# Patient Record
Sex: Female | Born: 1960 | Race: Black or African American | Hispanic: No | Marital: Single | Smoking: Former smoker
Health system: Southern US, Community
[De-identification: ages and names within clinical notes are randomized; demographics above are authoritative.]

## PROBLEM LIST (undated history)

## (undated) DIAGNOSIS — G8929 Other chronic pain: Secondary | ICD-10-CM

## (undated) DIAGNOSIS — E119 Type 2 diabetes mellitus without complications: Secondary | ICD-10-CM

## (undated) DIAGNOSIS — M5416 Radiculopathy, lumbar region: Secondary | ICD-10-CM

## (undated) DIAGNOSIS — I10 Essential (primary) hypertension: Secondary | ICD-10-CM

## (undated) DIAGNOSIS — F329 Major depressive disorder, single episode, unspecified: Secondary | ICD-10-CM

## (undated) DIAGNOSIS — K219 Gastro-esophageal reflux disease without esophagitis: Secondary | ICD-10-CM

## (undated) DIAGNOSIS — F32A Depression, unspecified: Secondary | ICD-10-CM

## (undated) DIAGNOSIS — E782 Mixed hyperlipidemia: Secondary | ICD-10-CM

## (undated) DIAGNOSIS — M549 Dorsalgia, unspecified: Secondary | ICD-10-CM

## (undated) DIAGNOSIS — F419 Anxiety disorder, unspecified: Secondary | ICD-10-CM

## (undated) DIAGNOSIS — G4733 Obstructive sleep apnea (adult) (pediatric): Secondary | ICD-10-CM

## (undated) DIAGNOSIS — E669 Obesity, unspecified: Secondary | ICD-10-CM

## (undated) HISTORY — DX: Type 2 diabetes mellitus without complications: E11.9

## (undated) HISTORY — DX: Obstructive sleep apnea (adult) (pediatric): G47.33

## (undated) HISTORY — DX: Gastro-esophageal reflux disease without esophagitis: K21.9

## (undated) HISTORY — DX: Mixed hyperlipidemia: E78.2

## (undated) HISTORY — DX: Essential (primary) hypertension: I10

## (undated) HISTORY — DX: Obesity, unspecified: E66.9

## (undated) HISTORY — DX: Major depressive disorder, single episode, unspecified: F32.9

## (undated) HISTORY — DX: Depression, unspecified: F32.A

---

## 2009-01-26 ENCOUNTER — Emergency Department (HOSPITAL_COMMUNITY): Admission: EM | Admit: 2009-01-26 | Discharge: 2009-01-26 | Payer: Self-pay | Admitting: Emergency Medicine

## 2009-08-23 ENCOUNTER — Emergency Department (HOSPITAL_COMMUNITY): Admission: EM | Admit: 2009-08-23 | Discharge: 2009-08-23 | Payer: Self-pay | Admitting: Emergency Medicine

## 2010-02-18 ENCOUNTER — Ambulatory Visit: Payer: Self-pay | Admitting: Internal Medicine

## 2010-02-18 DIAGNOSIS — J45909 Unspecified asthma, uncomplicated: Secondary | ICD-10-CM | POA: Insufficient documentation

## 2010-02-18 DIAGNOSIS — E785 Hyperlipidemia, unspecified: Secondary | ICD-10-CM | POA: Insufficient documentation

## 2010-02-18 DIAGNOSIS — R05 Cough: Secondary | ICD-10-CM

## 2010-02-18 DIAGNOSIS — I10 Essential (primary) hypertension: Secondary | ICD-10-CM | POA: Insufficient documentation

## 2010-02-18 DIAGNOSIS — G4730 Sleep apnea, unspecified: Secondary | ICD-10-CM | POA: Insufficient documentation

## 2010-02-23 ENCOUNTER — Telehealth (INDEPENDENT_AMBULATORY_CARE_PROVIDER_SITE_OTHER): Payer: Self-pay | Admitting: *Deleted

## 2010-03-05 ENCOUNTER — Ambulatory Visit: Payer: Self-pay | Admitting: Internal Medicine

## 2010-04-09 ENCOUNTER — Emergency Department (HOSPITAL_COMMUNITY): Admission: EM | Admit: 2010-04-09 | Discharge: 2010-04-09 | Payer: Self-pay | Admitting: Emergency Medicine

## 2010-04-29 ENCOUNTER — Telehealth (INDEPENDENT_AMBULATORY_CARE_PROVIDER_SITE_OTHER): Payer: Self-pay | Admitting: *Deleted

## 2010-05-03 ENCOUNTER — Encounter: Admission: RE | Admit: 2010-05-03 | Discharge: 2010-05-03 | Payer: Self-pay | Admitting: Family Medicine

## 2010-05-14 ENCOUNTER — Ambulatory Visit: Payer: Self-pay | Admitting: Internal Medicine

## 2010-06-10 ENCOUNTER — Ambulatory Visit: Payer: Self-pay | Admitting: Internal Medicine

## 2010-07-08 ENCOUNTER — Telehealth: Payer: Self-pay | Admitting: Internal Medicine

## 2010-08-11 ENCOUNTER — Ambulatory Visit (HOSPITAL_COMMUNITY)
Admission: RE | Admit: 2010-08-11 | Discharge: 2010-08-11 | Payer: Self-pay | Source: Home / Self Care | Admitting: Family Medicine

## 2010-12-15 NOTE — Progress Notes (Signed)
Summary: Records request from [facility]  Request for records received from [facility]. Request forwarded to [facility]. Dena Chavis  February 23, 2010 9:53 AM

## 2010-12-15 NOTE — Progress Notes (Signed)
  Request Recieved from [facility] sent to[facility]  [facility]  April 29, 2010 2:09 PM

## 2010-12-15 NOTE — Progress Notes (Signed)
Summary: nos appt  Phone Note Call from Patient   Caller: [email or web address]  Call For: Henryetta Corriveau Summary of Call: In ref to nos from 8/23, pt states she will call to rsc later this week. Initial call taken by: Darletta Moll,  July 08, 2010 10:03 AM

## 2010-12-15 NOTE — Assessment & Plan Note (Signed)
Summary: NP follow up - med calendar   Copy to:  Belva Agee, FNP Primary Provider/Referring Provider:  Belva Agee, FNP  CC:  new med calendar - pt brought all meds with her today.  no complaints..  History of Present Illness: 50 yobf quit  smoking 2005 with no resp problems that she can recall @ wt = 279  February 18, 2010 cc cough started  08/2009 while  on lisinopril stopped but seems gradually getting worse  assoc with doe, seemed better zpak / prednisone better for few weeks/   using lots of halls.  Seems worse about 30 min after lying down unless lies on  back,  does not typically  worse in ams  not much mucus and it's not  clearly assoc with nasal drainage.      March 05, 2010--Presents for follow up and med review. Last visit, tx w/ steroid taper and GERD /Rhinitis prevention. Now off ACE. He is feeling much better, 90% better.  Cough is much better, barely any cough. We reviewed all her meds and organized them in a med calendar. Denies chest pain, dyspnea, orthopnea, hemoptysis, fever, n/v/d, edema, headache.   Preventive Screening-Counseling & Management  Alcohol-Tobacco     Smoking Status: quit  Medications Prior to Update: 1)  Glimepiride 2 Mg Tabs (Glimepiride) .Marland Kitchen.. 1 Once Daily 2)  Actos 30 Mg Tabs (Pioglitazone Hcl) .Marland Kitchen.. 1 Once Daily 3)  Metformin Hcl 1000 Mg Tabs (Metformin Hcl) .Marland Kitchen.. 1 Two Times A Day 4)  Diovan 40 Mg Tabs (Valsartan) .Marland Kitchen.. 1 Once Daily 5)  Simvastatin (? Strength) .Marland Kitchen.. 1 Once Daily 6)  Tricor 48 Mg Tabs (Fenofibrate) .Marland Kitchen.. 1 Once Daily 7)  Omeprazole 20 Mg  Cpdr (Omeprazole) .... By Mouth Daily. Take One Half Hour Before Eating Breakfast 8)  Cpap .... At Bedtime 9)  Chlor-Trimeton 4 Mg Tabs (Chlorpheniramine Maleate) .... One Every 6 Hours For Dripping Nose Sneezing Itch 10)  Ventolin Hfa 108 (90 Base) Mcg/act Aers (Albuterol Sulfate) .... 2 Puffs Every 4-6 Hours As Needed 11)  Aspirin 81 Mg Tbec (Aspirin) .Marland Kitchen.. 1 Once Daily 12)  Prednisone 10 Mg  Tabs  (Prednisone) .... 4 Each Am X 2days, 2x2days, 1x2days and Stop  Current Medications (verified): 1)  Glimepiride 2 Mg Tabs (Glimepiride) .Marland Kitchen.. 1 Once Daily W/ Meal 2)  Actos 30 Mg Tabs (Pioglitazone Hcl) .Marland Kitchen.. 1 Once Daily W/ Meal 3)  Metformin Hcl 1000 Mg Tabs (Metformin Hcl) .Marland Kitchen.. 1 Two Times A Day With Breakfast and Dinner 4)  Diovan 40 Mg Tabs (Valsartan) .Marland Kitchen.. 1 Once Daily 5)  Hydrochlorothiazide 25 Mg Tabs (Hydrochlorothiazide) .... Take 1 Tablet By Mouth Once A Day 6)  Gabapentin 300 Mg Caps (Gabapentin) .... Take 1 Capsule By Mouth Three Times A Day 7)  Simvastatin 40 Mg Tabs (Simvastatin) .... Take 1 Tab By Mouth At Bedtime 8)  Tricor 48 Mg Tabs (Fenofibrate) .... Take 1 Tab By Mouth At Bedtime 9)  Omeprazole 20 Mg  Cpdr (Omeprazole) .... By Mouth Daily. Take One Half Hour Before Eating Breakfast 10)  Cpap .... At Bedtime 11)  Delsym 30 Mg/51ml Lqcr (Dextromethorphan Polistirex) .Marland Kitchen.. 1-2 Teaspoons Every 12 Hours As Needed 12)  Chlor-Trimeton 4 Mg Tabs (Chlorpheniramine Maleate) .... One Every 6 Hours As Needed 13)  Ventolin Hfa 108 (90 Base) Mcg/act Aers (Albuterol Sulfate) .... 2 Puffs Every 4-6 Hours As Needed 14)  Fluconazole 150 Mg Tabs (Fluconazole) .Marland Kitchen.. 1 Tab For 1 Day As Needed 15)  Diazepam 5 Mg  Tabs (Diazepam) .Marland Kitchen.. 1 Tab By Mouth Every 8 Hours As Needed 16)  Ibuprofen 200 Mg Tabs (Ibuprofen) .... Per Bottle  Allergies (verified): 1)  ! * Robitussin  Past History:  Family History: Last updated: 02/18/2010 Emphysema- Paternal Aunt Asthma- Maternal Uncles Heart dz- Mother and Father (both had MI) Throat CA- Brother RA- Mother and Father  Social History: Last updated: 02/18/2010 Single Lives with Son Unemployed Former smoker.  Quit in 2005.  Smoked approx 28 yrs.  Smoked up to 1/2 ppd. No ETOH since 1991  Risk Factors: Smoking Status: quit (03/05/2010)  Past Medical History: Asthma Diabetes Hyperlipidemia Hypertension Sleep  Apnea Cough...........................................................Marland KitchenWert Complex med regimen-Meds reviewed with pt education and computerized med calendar completed March 09, 2010  Social History: Smoking Status:  quit  Review of Systems      See HPI  Vital Signs:  Patient profile:   50 year old female Height:      66 inches Weight:      355 pounds BMI:     57.51 O2 Sat:      97 % on Room air Temp:     98.7 degrees F oral Pulse rate:   80 / minute BP sitting:   128 / 80  (left arm) Cuff size:   regular  Vitals Entered By: Boone Master CNA (March 05, 2010 2:13 PM)  O2 Flow:  Room air CC: new med calendar - pt brought all meds with her today.  no complaints. Is Patient Diabetic? Yes Comments Medications reviewed with patient Daytime contact number verified with patient. Boone Master CNA  March 05, 2010 2:14 PM    Physical Exam  Additional Exam:  obese hoarse bf nad  wt 346 February 18, 2010 >>355 March 09, 2010  HEENT: nl dentition, turbinates, and orophanx. Nl external ear canals without cough reflex NECK :  without JVD/Nodes/TM/ nl carotid upstrokes bilaterally LUNGS: no acc muscle use, clear to A and P bilaterally without cough on insp or exp maneuvers CV:  RRR  no s3 or murmur or increase in P2, no edema  ABD:  soft and nontender with nl excursion in the supine position. No bruits or organomegaly, bowel sounds nl MS:  warm without deformities, calf tenderness, cyanosis or clubbing SKIN: warm and dry without lesions   NEURO:  alert, approp, no deficits     Impression & Recommendations:  Problem # 1:  COUGH (ICD-786.2)  Improved w/ d/c of ACE and tx aimed at GERD/Rhinitis prevention Meds reviewed with pt education and computerized med calendar completed/adjusted.    REC:  Remain off of lovaza and all oil based vitamin (vit d)  GERD (REFLUX)  is a common cause of respiratory symptoms. It commonly presents without heartburn and can be treated with medication,  but also with lifestyle changes including avoidance of late meals, excessive alcohol, smoking cessation, and avoid fatty foods, chocolate, peppermint, colas, red wine, and acidic juices such as orange juice. NO MINT OR MENTHOL PRODUCTS SO NO COUGH DROPS  USE SUGARLESS CANDY INSTEAD (jolley ranchers)  Follow med calendar closely and bring to each visit.  follow up 3 weeks Dr. Sherene Sires   Orders: Est. Patient Level III [phone])  Medications Added to Medication List This Visit: 1)  Glimepiride 2 Mg Tabs (Glimepiride) .Marland Kitchen.. 1 once daily w/ meal 2)  Actos 30 Mg Tabs (Pioglitazone hcl) .Marland Kitchen.. 1 once daily w/ meal 3)  Metformin Hcl 1000 Mg Tabs (Metformin hcl) .Marland Kitchen.. 1 two times a day with breakfast and  dinner 4)  Hydrochlorothiazide 25 Mg Tabs (Hydrochlorothiazide) .... Take 1 tablet by mouth once a day 5)  Gabapentin 300 Mg Caps (Gabapentin) .... Take 1 capsule by mouth three times a day 6)  Simvastatin 40 Mg Tabs (Simvastatin) .... Take 1 tab by mouth at bedtime 7)  Tricor 48 Mg Tabs (Fenofibrate) .... Take 1 tab by mouth at bedtime 8)  Delsym 30 Mg/56ml Lqcr (Dextromethorphan polistirex) .Marland Kitchen.. 1-2 teaspoons every 12 hours as needed 9)  Chlor-trimeton 4 Mg Tabs (Chlorpheniramine maleate) .... One every 6 hours as needed 10)  Fluconazole 150 Mg Tabs (Fluconazole) .Marland Kitchen.. 1 tab for 1 day as needed 11)  Diazepam 5 Mg Tabs (Diazepam) .Marland Kitchen.. 1 tab by mouth every 8 hours as needed 12)  Ibuprofen 200 Mg Tabs (Ibuprofen) .... Per bottle  Complete Medication List: 1)  Glimepiride 2 Mg Tabs (Glimepiride) .Marland Kitchen.. 1 once daily w/ meal 2)  Actos 30 Mg Tabs (Pioglitazone hcl) .Marland Kitchen.. 1 once daily w/ meal 3)  Metformin Hcl 1000 Mg Tabs (Metformin hcl) .Marland Kitchen.. 1 two times a day with breakfast and dinner 4)  Diovan 40 Mg Tabs (Valsartan) .Marland Kitchen.. 1 once daily 5)  Hydrochlorothiazide 25 Mg Tabs (Hydrochlorothiazide) .... Take 1 tablet by mouth once a day 6)  Gabapentin 300 Mg Caps (Gabapentin) .... Take 1 capsule by mouth three times a  day 7)  Simvastatin 40 Mg Tabs (Simvastatin) .... Take 1 tab by mouth at bedtime 8)  Tricor 48 Mg Tabs (Fenofibrate) .... Take 1 tab by mouth at bedtime 9)  Omeprazole 20 Mg Cpdr (Omeprazole) .... By mouth daily. take one half hour before eating breakfast 10)  Cpap  .... At bedtime 11)  Delsym 30 Mg/18ml Lqcr (Dextromethorphan polistirex) .Marland Kitchen.. 1-2 teaspoons every 12 hours as needed 12)  Chlor-trimeton 4 Mg Tabs (Chlorpheniramine maleate) .... One every 6 hours as needed 13)  Ventolin Hfa 108 (90 Base) Mcg/act Aers (Albuterol sulfate) .... 2 puffs every 4-6 hours as needed 14)  Fluconazole 150 Mg Tabs (Fluconazole) .Marland Kitchen.. 1 tab for 1 day as needed 15)  Diazepam 5 Mg Tabs (Diazepam) .Marland Kitchen.. 1 tab by mouth every 8 hours as needed 16)  Ibuprofen 200 Mg Tabs (Ibuprofen) .... Per bottle   Patient Instructions: 1)  Remain off of lovaza and all oil based vitamin (vit d)  2)  GERD (REFLUX)  is a common cause of respiratory symptoms. It commonly presents without heartburn and can be treated with medication, but also with lifestyle changes including avoidance of late meals, excessive alcohol, smoking cessation, and avoid fatty foods, chocolate, peppermint, colas, red wine, and acidic juices such as orange juice. NO MINT OR MENTHOL PRODUCTS SO NO COUGH DROPS  3)  USE SUGARLESS CANDY INSTEAD (jolley ranchers)  4)  Follow med calendar closely and bring to each visit.  5)  follow up 3 weeks Dr. Sherene Sires    Immunization History:  Influenza Immunization History:    Influenza:  declines (02/26/2010)

## 2010-12-15 NOTE — Assessment & Plan Note (Signed)
Summary: Pulmonary/ ext f/u with hfa now 75% but dulera count way off   Copy to:  Belva Agee, FNP Primary Provider/Referring Provider:  Belva Agee, FNP  CC:  4 wk followup with cxr.  Pt states that cough is the same- no better or worse.  Cough is worse at night.  She also c/o increased SOB with exertion over the past several wks.  She states sometimes she wheezes when lies down.  Marland Kitchen  History of Present Illness: 18 yobf quit  smoking 2005 with no resp problems that she can recall @ wt = 279  February 18, 2010 cc cough started  08/2009 while  on lisinopril stopped but seems gradually getting worse  assoc with doe, seemed better zpak / prednisone better for few weeks/   using lots of halls.  Seems worse about 30 min after lying down unless lies on  back,  does not typically  worse in ams  not much mucus and it's not  clearly assoc with nasal drainage.      March 05, 2010--Presents for follow up and med review. Last visit, tx w/ steroid taper and GERD /Rhinitis prevention. Now off ACE.  feeling much better, 90% better.  Cough is much better, barely any cough. We reviewed all her meds and organized them in a med calendar.   May 14, 2010 Followup.  Pt states that her cough is "a little better".  Still has prod cough with clear sputum- usually only occurs at hs but doesn't wake her at night or exac in am. using albuterol an avg of twice dialy for relief of cough and sob.  red add pepcid 20 mg at  bedtime and     - Trial of dulera 100 May 14, 2010 >> did not use but only 16 puffs gone by June 10, 2010   June 10, 2010 4 wk followup with cxr.  Pt states that cough is the same- no better or worse.  Cough is worse at night.  She also c/o increased SOB with exertion over the past several wks.  She states sometimes she wheezes when lies down.  has calendar, not using it.  Pt denies any significant sore throat, dysphagia, itching, sneezing,  nasal congestion or excess secretions,  fever, chills, sweats, unintended  wt loss, pleuritic or exertional cp, hempoptysis, change in activity tolerance  orthopnea pnd or leg swelling. can't use cpap at night due to cough. chlortrimeton stops nose from dripping no impact on noct cough.    Current Medications (verified): 1)  Glimepiride 2 Mg Tabs (Glimepiride) .Marland Kitchen.. 1 Once Daily W/ Meal 2)  Actos 30 Mg Tabs (Pioglitazone Hcl) .Marland Kitchen.. 1 Once Daily W/ Meal 3)  Metformin Hcl 1000 Mg Tabs (Metformin Hcl) .Marland Kitchen.. 1 Two Times A Day With Breakfast and Dinner 4)  Diovan 40 Mg Tabs (Valsartan) .Marland Kitchen.. 1 Once Daily 5)  Hydrochlorothiazide 25 Mg Tabs (Hydrochlorothiazide) .... Take 1 Tablet By Mouth Once A Day 6)  Gabapentin 300 Mg Caps (Gabapentin) .... Take 1 Capsule By Mouth Three Times A Day 7)  Simvastatin 40 Mg Tabs (Simvastatin) .... Take 1 Tab By Mouth At Bedtime 8)  Tricor 48 Mg Tabs (Fenofibrate) .... Take 1 Tab By Mouth At Bedtime 9)  Omeprazole 20 Mg  Cpdr (Omeprazole) .... By Mouth Daily. Take One Half Hour Before Eating Breakfast 10)  Cpap .... At Bedtime 11)  Cymbalta 60 Mg Cpep (Duloxetine Hcl) .Marland Kitchen.. 1 Once Daily 12)  Dulera 100-5 Mcg/act Aero (Mometasone Furo-Formoterol Fum) .Marland KitchenMarland KitchenMarland Kitchen  2 Puffs First Thing  in Am and 2 Puffs Again in Pm About 12 Hours Later 13)  Delsym 30 Mg/41ml Lqcr (Dextromethorphan Polistirex) .Marland Kitchen.. 1-2 Teaspoons Every 12 Hours As Needed 14)  Chlor-Trimeton 4 Mg Tabs (Chlorpheniramine Maleate) .... One Every 6 Hours As Needed 15)  Ventolin Hfa 108 (90 Base) Mcg/act Aers (Albuterol Sulfate) .... 2 Puffs Every 4-6 Hours As Needed 16)  Fluconazole 150 Mg Tabs (Fluconazole) .Marland Kitchen.. 1 Tab For 1 Day As Needed 17)  Diazepam 5 Mg Tabs (Diazepam) .Marland Kitchen.. 1 Tab By Mouth Every 8 Hours As Needed 18)  Ibuprofen 200 Mg Tabs (Ibuprofen) .... Per Bottle  Allergies (verified): 1)  ! * Robitussin  Past History:  Past Medical History: Asthma Diabetes Hyperlipidemia Hypertension Sleep Apnea Cough.............................................................Marland KitchenWert      -  Trial of dulera 100 May 14, 2010 >> did not use but 16 puffs gone by June 10, 2010  Complex med regimen-Meds reviewed with pt education and computerized med calendar completed March 09, 2010  Vital Signs:  Patient profile:   50 year old female Weight:      361 pounds O2 Sat:      97 % on Room air Temp:     98.8 degrees F oral Pulse rate:   81 / minute BP sitting:   128 / 86  (left arm) Cuff size:   large  Vitals Entered By: Vernie Murders (June 10, 2010 11:58 AM)  O2 Flow:  Room air  Physical Exam  Additional Exam:  obese hoarse bf nad tearful, barely able to climb on exam table due to wt, not sob wt 346 February 18, 2010 >>355 March 09, 2010 > 355 May 14, 2010 > 361 June 10, 2010  HEENT: nl dentition, turbinates, and orophanx. Nl external ear canals without cough reflex NECK :  without JVD/Nodes/TM/ nl carotid upstrokes bilaterally LUNGS: no acc muscle use, clear to A and P bilaterally without cough on insp or exp maneuvers CV:  RRR  no s3 or murmur or increase in P2, no edema  ABD:  soft and nontender with nl excursion in the supine position. No bruits or organomegaly, bowel sounds nl MS:  warm without deformities, calf tenderness, cyanosis or clubbing      Impression & Recommendations:  Problem # 1:  COUGH (ICD-786.2) The standardized cough guidelines recently published in Chest are a 14 step process, not a single office visit,  and are intended  to address this problem logically,  with an alogrithm dependent on response to each progressive step  to determine a specific diagnosis with  minimal addtional testing needed. Therefore if compliance is an issue this empiric standardized approach simply won't work.   She is not compliant with dulera by count and until she is can't take the next step.   Of the three most common causes of chronic cough, only one (GERD)  can actually cause the other two(asthma and rhinitis)  and perpetuate the cylce of cough inducing airway trauma,  inflammation, heightened sensitivity to reflux which is prompted by the cough itself via a cyclical mechanism.  This may partially respond to steroids and look like asthma and post nasal drainage but never erradicated completely unless the cough and the secondary reflux are eliminated, preferably both at the same time. try hs tramadol   Each maintenance medication was reviewed in detail including most importantly the difference between maintenance and as needed and under what circumstances the prns are to be used. This was done in  the context of a medication calendar review which provided the patient with a user-friendly unambiguous mechanism for medication administration and reconciliation and provides an action plan for all active problems. It is critical that this be shown to every doctor  for modification during the office visit if necessary so the patient can use it as a working document.   Problem # 2:  ASTHMA (ICD-493.90) I spent extra time with the patient today explaining optimal mdi  technique.  This improved from  25-75% with coaching  Medications Added to Medication List This Visit: 1)  Tramadol Hcl 50 Mg Tabs (Tramadol hcl) .... One to two by mouth every 4-6 hours for cough  Other Orders: T-2 View CXR (71020TC) Est. Patient Level IV (29528)  Patient Instructions: 1)  See calendar for specific medication instructions and bring it back for each and every office visit for every healthcare provider you see.  Without it,  you may not receive the best quality medical care that we feel you deserve.  2)  Try tramadol 50 mg one at bedtime as need for cough 3)  Unlike when you get a prescription for eyeglasses, it's not possible to always walk out of this or any medical office with a perfect prescription that is immediately effective  based on any test that we offer here.  On the contrary, it may take several weeks for the full impact of changes recommened today - hopefully you will respond  well.  If not, then we'll adjust your medication on your next visit accordingly, knowing more then than we can possibly know now.    4)  Please schedule a follow-up appointment in 3 weeks, sooner if needed  Prescriptions: TRAMADOL HCL 50 MG  TABS (TRAMADOL HCL) One to two by mouth every 4-6 hours for cough  #40 x 0   Entered and Authorized by:   Nyoka Cowden MD   Signed by:   Nyoka Cowden MD on 06/10/2010   Method used:   Electronically to        [facility] [phone]* (retail)       [address] [place], [place]  [address]       Ph: [phone] or [phone]       Fax: [phone]   RxID:   [id number]

## 2010-12-15 NOTE — Assessment & Plan Note (Signed)
Summary: Pulmonary/ ext ov with trial of dulera 100   Copy to:  Belva Agee, FNP Primary Provider/Referring Provider:  Belva Agee, FNP  CC:  Followup.  Pt states that her cough is "a little better".  Still has prod cough with clear sputum- usually only occurs at hs..  History of Present Illness: 22 yobf quit  smoking 2005 with no resp problems that she can recall @ wt = 279  February 18, 2010 cc cough started  08/2009 while  on lisinopril stopped but seems gradually getting worse  assoc with doe, seemed better zpak / prednisone better for few weeks/   using lots of halls.  Seems worse about 30 min after lying down unless lies on  back,  does not typically  worse in ams  not much mucus and it's not  clearly assoc with nasal drainage.      March 05, 2010--Presents for follow up and med review. Last visit, tx w/ steroid taper and GERD /Rhinitis prevention. Now off ACE.  feeling much better, 90% better.  Cough is much better, barely any cough. We reviewed all her meds and organized them in a med calendar.   May 14, 2010 Followup.  Pt states that her cough is "a little better".  Still has prod cough with clear sputum- usually only occurs at hs but doesn't wake her at night or exac in am. using albuterol an avg of twice dialy for relief of cough and sob.   Pt denies any significant sore throat, dysphagia, itching, sneezing,  nasal congestion or excess secretions,  fever, chills, sweats, unintended wt loss, pleuritic or exertional cp, hempoptysis, change in activity tolerance  orthopnea pnd or leg swelling   Current Medications (verified): 1)  Glimepiride 2 Mg Tabs (Glimepiride) .Marland Kitchen.. 1 Once Daily W/ Meal 2)  Actos 30 Mg Tabs (Pioglitazone Hcl) .Marland Kitchen.. 1 Once Daily W/ Meal 3)  Metformin Hcl 1000 Mg Tabs (Metformin Hcl) .Marland Kitchen.. 1 Two Times A Day With Breakfast and Dinner 4)  Diovan 40 Mg Tabs (Valsartan) .Marland Kitchen.. 1 Once Daily 5)  Hydrochlorothiazide 25 Mg Tabs (Hydrochlorothiazide) .... Take 1 Tablet By Mouth  Once A Day 6)  Gabapentin 300 Mg Caps (Gabapentin) .... Take 1 Capsule By Mouth Three Times A Day 7)  Simvastatin 40 Mg Tabs (Simvastatin) .... Take 1 Tab By Mouth At Bedtime 8)  Tricor 48 Mg Tabs (Fenofibrate) .... Take 1 Tab By Mouth At Bedtime 9)  Omeprazole 20 Mg  Cpdr (Omeprazole) .... By Mouth Daily. Take One Half Hour Before Eating Breakfast 10)  Cpap .... At Bedtime 11)  Cymbalta 60 Mg Cpep (Duloxetine Hcl) .Marland Kitchen.. 1 Once Daily 12)  Delsym 30 Mg/26ml Lqcr (Dextromethorphan Polistirex) .Marland Kitchen.. 1-2 Teaspoons Every 12 Hours As Needed 13)  Chlor-Trimeton 4 Mg Tabs (Chlorpheniramine Maleate) .... One Every 6 Hours As Needed 14)  Ventolin Hfa 108 (90 Base) Mcg/act Aers (Albuterol Sulfate) .... 2 Puffs Every 4-6 Hours As Needed 15)  Fluconazole 150 Mg Tabs (Fluconazole) .Marland Kitchen.. 1 Tab For 1 Day As Needed 16)  Diazepam 5 Mg Tabs (Diazepam) .Marland Kitchen.. 1 Tab By Mouth Every 8 Hours As Needed 17)  Ibuprofen 200 Mg Tabs (Ibuprofen) .... Per Bottle  Allergies (verified): 1)  ! * Robitussin  Past History:  Past Medical History: Asthma Diabetes Hyperlipidemia Hypertension Sleep Apnea Cough...........................................................Marland KitchenWert      - Trial of dulera 100 May 14, 2010  Complex med regimen-Meds reviewed with pt education and computerized med calendar completed March 09, 2010  Vital  Signs:  Patient profile:   50 year old female Weight:      350 pounds O2 Sat:      92 % on Room air Temp:     98.5 degrees F oral Pulse rate:   103 / minute BP sitting:   128 / 70  (left arm) Cuff size:   large  Vitals Entered By: Vernie Murders (May 14, 2010 11:49 AM)  O2 Flow:  Room air  Physical Exam  Additional Exam:  obese hoarse bf nad  wt 346 February 18, 2010 >>355 March 09, 2010 > 355 May 14, 2010  HEENT: nl dentition, turbinates, and orophanx. Nl external ear canals without cough reflex NECK :  without JVD/Nodes/TM/ nl carotid upstrokes bilaterally LUNGS: no acc muscle use, clear to  A and P bilaterally without cough on insp or exp maneuvers CV:  RRR  no s3 or murmur or increase in P2, no edema  ABD:  soft and nontender with nl excursion in the supine position. No bruits or organomegaly, bowel sounds nl MS:  warm without deformities, calf tenderness, cyanosis or clubbing      Impression & Recommendations:  Problem # 1:  COUGH (ICD-786.2)  better with rx of reflux and off ace. try pepcid at bedtime next  The standardized cough guidelines recently published in Chest are a 14 step process, not a single office visit,  and are intended  to address this problem logically,  with an alogrithm dependent on response to each progressive step  to determine a specific diagnosis with  minimal addtional testing needed. Therefore if compliance is an issue this empiric standardized approach simply won't work.   Orders: Est. Patient Level IV (11914)  Problem # 2:  ASTHMA (ICD-493.90)   DDX of  difficult airways managment all start with A and  include Adherence, Ace Inhibitors, Acid Reflux, Active Sinus Disease, Alpha 1 Antitripsin deficiency, Anxiety masquerading as Airways dz,  ABPA,  allergy(esp in young), Aspiration (esp in elderly), Adverse effects of DPI,  Active smokers, plus one B  = Beta blocker use..   In this case Adherence is the biggest issue and starts with  inability to use HFA effectively and also  understand that SABA treats the symptoms but doesn't get to the underlying problem (inflammation).  I used  the ananology of putting steroid cream on a rash to help explain the meaning of topical therapy and the need to get the drug to the target tissue.  try adding dulera  I spent extra time with the patient today explaining optimal mdi  technique.  This improved from  50-75% with coaching  Problem # 3:  HYPERTENSION (ICD-401.9)  Her updated medication list for this problem includes:    Diovan 40 Mg Tabs (Valsartan) .Marland Kitchen... 1 once daily    Hydrochlorothiazide 25 Mg Tabs  (Hydrochlorothiazide) .Marland Kitchen... Take 1 tablet by mouth once a day  ok off ace.   ACE inhibitors are problematic in  pts with airway complaints because  even experienced pulmonologists can't always distinguish ace effects from copd/asthma.  By themselves they don't actually cause a problem, much like oxygen can't by itself start a fire, but they certainly serve as a powerful catalyst or enhancer for any "fire"  or inflammatory process in the upper airway, be it caused by an ET  tube or more commonly reflux (especially in the obese or pts with known GERD or who are on biphoshonates).  In the era of ARB near equivalency until we  have a better handle on the reversibility of the airway problem, it just makes sense to avoid ace entirely in the short run and then decide later, having established a level of airway control using a reasonable limited regimen, whether to add back ace but even then being very careful to observe the pt for worsening airway control and number of meds used/ needed to control symptoms.    Medications Added to Medication List This Visit: 1)  Cymbalta 60 Mg Cpep (Duloxetine hcl) .Marland Kitchen.. 1 once daily 2)  Dulera 100-5 Mcg/act Aero (Mometasone furo-formoterol fum) .... 2 puffs first thing  in am and 2 puffs again in pm about 12 hours later  Patient Instructions: 1)  Pepcid 20 mg one at bedtime 2)  GERD (REFLUX)  is a common cause of respiratory symptoms. It commonly presents without heartburn and can be treated with medication, but also with lifestyle changes including avoidance of late meals, excessive alcohol, smoking cessation, and avoid fatty foods, chocolate, peppermint, colas, red wine, and acidic juices such as orange juice. NO MINT OR MENTHOL PRODUCTS SO NO COUGH DROPS  3)  USE SUGARLESS CANDY INSTEAD (jolley ranchers)  4)  NO OIL BASED VITAMINS 5)  Please schedule a follow-up appointment in 4 weeks, sooner if needed with CXR on return

## 2010-12-15 NOTE — Assessment & Plan Note (Signed)
Summary: Pulmonary new pt eval/  uacs vs gerd   Visit Type:  Initial Consult Copy to:  Belva Agee, FNP Primary Provider/Referring Provider:  Belva Agee, FNP  CC:  Cough.  History of Present Illness: 89 yobf quit  smoking 2005 with no resp problems that she can recall @ wt = 279  February 18, 2010 cc cough started  08/2009 while  on lisinopril stopped but seems gradually getting worse  assoc with doe, seemed better zpak / prednisone better for few weeks/   using lots of halls.  Seems worse about 30 min after lying down unless lies on  back,  does not typically  worse in ams  not much mucus and it's not  clearly assoc with nasal drainage.   Pt denies any significant sore throat, dysphagia,  fever, chills, sweats, unintended wt loss, pleuritic or exertional cp, hempoptysis, change in activity tolerance  orthopnea pnd or leg swelling.  Pt also denies any obvious fluctuation in symptoms with weather or environmental change or other alleviating or aggravating factors.       Current Medications (verified): 1)  Lovaza 1 Gm Caps (Omega-3-Acid Ethyl Esters) .Marland Kitchen.. 1 Two Times A Day 2)  Diovan 40 Mg Tabs (Valsartan) .Marland Kitchen.. 1 Once Daily 3)  Actos 30 Mg Tabs (Pioglitazone Hcl) .Marland Kitchen.. 1 Once Daily 4)  Metformin Hcl 1000 Mg Tabs (Metformin Hcl) .Marland Kitchen.. 1 Two Times A Day 5)  Glimepiride 2 Mg Tabs (Glimepiride) .Marland Kitchen.. 1 Once Daily 6)  Simvastatin (? Strength) .Marland Kitchen.. 1 Once Daily 7)  Tricor 48 Mg Tabs (Fenofibrate) .Marland Kitchen.. 1 Once Daily 8)  Ventolin Hfa 108 (90 Base) Mcg/act Aers (Albuterol Sulfate) .... 2 Puffs Every 4-6 Hours As Needed 9)  Cpap .... At Bedtime 10)  Aspirin 81 Mg Tbec (Aspirin) .Marland Kitchen.. 1 Once Daily 11)  Vitamin D (Ergocalciferol) 50000 Unit Caps (Ergocalciferol) .Marland Kitchen.. 1 Every Week  Allergies (verified): 1)  ! * Robitussin  Past History:  Past Medical History: Asthma Diabetes Hyperlipidemia Hypertension Sleep Apnea Cough...........................................................Marland KitchenWert  Family  History: Emphysema- Paternal Aunt Asthma- Maternal Uncles Heart dz- Mother and Father (both had MI) Throat CA- Brother RA- Mother and Father  Social History: Single Lives with Son Unemployed Former smoker.  Quit in 2005.  Smoked approx 28 yrs.  Smoked up to 1/2 ppd. No ETOH since 1991  Review of Systems       The patient complains of shortness of breath with activity, shortness of breath at rest, productive cough, weight change, headaches, sneezing, itching, anxiety, hand/feet swelling, joint stiffness or pain, and rash.  The patient denies non-productive cough, coughing up blood, chest pain, irregular heartbeats, acid heartburn, indigestion, loss of appetite, abdominal pain, difficulty swallowing, sore throat, tooth/dental problems, nasal congestion/difficulty breathing through nose, ear ache, depression, change in color of mucus, and fever.    Vital Signs:  Patient profile:   50 year old female Height:      66 inches Weight:      346 pounds BMI:     56.05 O2 Sat:      99 % on Room air Temp:     97.3 degrees F oral Pulse rate:   80 / minute BP sitting:   128 / 80  (left arm) Cuff size:   large  Vitals Entered By: Vernie Murders (February 18, 2010 1:45 PM)  O2 Flow:  Room air  Physical Exam  Additional Exam:  obese hoarse bf nad  wt 346 February 18, 2010  HEENT: nl dentition,  turbinates, and orophanx. Nl external ear canals without cough reflex NECK :  without JVD/Nodes/TM/ nl carotid upstrokes bilaterally LUNGS: no acc muscle use, clear to A and P bilaterally without cough on insp or exp maneuvers CV:  RRR  no s3 or murmur or increase in P2, no edema  ABD:  soft and nontender with nl excursion in the supine position. No bruits or organomegaly, bowel sounds nl MS:  warm without deformities, calf tenderness, cyanosis or clubbing SKIN: warm and dry without lesions   NEURO:  alert, approp, no deficits     CXR  Procedure date:  01/19/2010  Findings:      wnl  Impression &  Recommendations:  Problem # 1:  COUGH (ICD-786.2) The most common causes of chronic cough in immunocompetent adults include: upper airway cough syndrome (UACS), previously referred to as postnasal drip syndrome,  caused by variety of rhinosinus conditions; (2) asthma; (3) GERD; (4) chronic bronchitis from cigarette smoking or other inhaled environmental irritants; (5) nonasthmatic eosinophilic bronchitis; and (6) bronchiectasis. These conditions, singly or in combination, have accounted for up to 94% of the causes of chronic cough in prospective studies.  Upper airway cough syndrome, so named because it's frequently impossible to sort out how much is LPR vs CR/sinusitis with freq throat clearing generating secondary extra esophageal GERD from wide swings in gastric pressure that occur with throat clearing, promoting self use of mint and menthol lozenges that reduce the lower esophageal sphincter tone and exacerbate the problem further.  These symptoms are easily confused with asthma/copd by even experienced pulmonogists because they overlap so much. These are the same pts who not infrequently have failed to tolerate ace inhibitors,  dry powder inhalers or biphosphonates or report having reflux symptoms that don't respond to standard doses of PPI   The standardized cough guidelines recently published in Chest are a 14 step process, not a single office visit,  and are intended  to address this problem logically,  with an alogrithm dependent on response to each progressive step  to determine a specific diagnosis with  minimal addtional testing needed. Therefore if compliance is an issue this empiric standardized approach simply won't work.  therefore  To keep things simple, I have asked the patient to first separate medicines that are perceived as maintenance, that is to be taken daily "no matter what", from those medicines that are taken on only on an as-needed basis and I have given the patient examples of  both, and then return to see our NP to generate a  detailed  medication calendar then go from there.  Medications Added to Medication List This Visit: 1)  Lovaza 1 Gm Caps (Omega-3-acid ethyl esters) .Marland Kitchen.. 1 two times a day 2)  Diovan 40 Mg Tabs (Valsartan) .Marland Kitchen.. 1 once daily 3)  Actos 30 Mg Tabs (Pioglitazone hcl) .Marland Kitchen.. 1 once daily 4)  Metformin Hcl 1000 Mg Tabs (Metformin hcl) .Marland Kitchen.. 1 two times a day 5)  Glimepiride 2 Mg Tabs (Glimepiride) .Marland Kitchen.. 1 once daily 6)  Simvastatin (? Strength)  .Marland Kitchen.. 1 once daily 7)  Tricor 48 Mg Tabs (Fenofibrate) .Marland Kitchen.. 1 once daily 8)  Ventolin Hfa 108 (90 Base) Mcg/act Aers (Albuterol sulfate) .... 2 puffs every 4-6 hours as needed 9)  Cpap  .... At bedtime 10)  Aspirin 81 Mg Tbec (Aspirin) .Marland Kitchen.. 1 once daily 11)  Vitamin D (ergocalciferol) 50000 Unit Caps (Ergocalciferol) .Marland Kitchen.. 1 every week 12)  Chlor-trimeton 4 Mg Tabs (Chlorpheniramine maleate) .... One every 6  hours for dripping nose sneezing itch 13)  Omeprazole 20 Mg Cpdr (Omeprazole) .... By mouth daily. take one half hour before eating breakfast 14)  Prednisone 10 Mg Tabs (Prednisone) .... 4 each am x 2days, 2x2days, 1x2days and stop  Other Orders: New Patient Level V (16109)  Patient Instructions: 1)  Stop lovaza and all oil based vitamin (vit d)  2)  GERD (REFLUX)  is a common cause of respiratory symptoms. It commonly presents without heartburn and can be treated with medication, but also with lifestyle changes including avoidance of late meals, excessive alcohol, smoking cessation, and avoid fatty foods, chocolate, peppermint, colas, red wine, and acidic juices such as orange juice. NO MINT OR MENTHOL PRODUCTS SO NO COUGH DROPS  3)  USE SUGARLESS CANDY INSTEAD (jolley ranchers)  4)  Prednisone 4 each am x 2days, 2x2days, 1x2days and stop 5)  Omeprazole 20 mg Take  one 30-60 min before first meal of the day  6)  Chlortrimeton 4 mg one every 6 hours for dripping nose sneezing itch 7)  See Tammy NP w/in 2  weeks with all your medications, even over the counter meds, separated in two separate bags, the ones you take no matter what vs the ones you stop once you feel better and take only as needed.  She will generate for you a new user friendly medication calendar that will put [place] all on the same page re: your medication use.  Prescriptions: PREDNISONE 10 MG  TABS (PREDNISONE) 4 each am x 2days, 2x2days, 1x2days and stop  #14 x 0   Entered and Authorized by:   Nyoka Cowden MD   Signed by:   Nyoka Cowden MD on 02/18/2010   Method used:   Electronically to        [facility] [phone]* (retail)       [address] [place], [place]  [address]       Ph: [phone] or [phone]       Fax: [phone]   RxID:   [id number] OMEPRAZOLE 20 MG  CPDR (OMEPRAZOLE) By mouth daily. Take one half hour before eating breakfast  #30 x 11   Entered and Authorized by:   Nyoka Cowden MD   Signed by:   Nyoka Cowden MD on 02/18/2010   Method used:   Electronically to        [facility] [phone]* (retail)       [address] [place], [place]  [address]       Ph: [phone] or [phone]       Fax: [phone]   RxID:   [id number]

## 2011-02-01 LAB — URINALYSIS, ROUTINE W REFLEX MICROSCOPIC
Bilirubin Urine: NEGATIVE
Glucose, UA: NEGATIVE mg/dL
Ketones, ur: NEGATIVE mg/dL
Nitrite: NEGATIVE

## 2011-02-01 LAB — BASIC METABOLIC PANEL
BUN: 8 mg/dL (ref 6–23)
Calcium: 9.4 mg/dL (ref 8.4–10.5)
Chloride: 100 mEq/L (ref 96–112)
Creatinine, Ser: 0.95 mg/dL (ref 0.4–1.2)
GFR calc Af Amer: >60 mL/min (ref >60)
Potassium: 3.7 mEq/L (ref 3.5–5.1)

## 2011-02-01 LAB — DIFFERENTIAL
Basophils Relative: 1 % (ref 0–1)
Lymphocytes Relative: 34 % (ref 12–46)
Neutrophils Relative %: 58 % (ref 43–77)

## 2011-02-01 LAB — CBC
HCT: 33.5 % — ABNORMAL LOW (ref 36.0–46.0)
Hemoglobin: 11.2 g/dL — ABNORMAL LOW (ref 12.0–15.0)
MCV: 93.2 fL (ref 78.0–100.0)
Platelets: 331 10*3/uL (ref 150–400)

## 2011-02-18 LAB — URINALYSIS, ROUTINE W REFLEX MICROSCOPIC
Bilirubin Urine: NEGATIVE
Glucose, UA: >1000 mg/dL — AB
Protein, ur: NEGATIVE mg/dL
Specific Gravity, Urine: 1.015 (ref 1.005–1.030)
Urobilinogen, UA: 0.2 mg/dL (ref 0.0–1.0)
pH: 5.5 (ref 5.0–8.0)

## 2011-02-18 LAB — CBC
HCT: 43.1 % (ref 36.0–46.0)
Hemoglobin: 14.7 g/dL (ref 12.0–15.0)
MCHC: 34.1 g/dL (ref 30.0–36.0)
MCV: 90.2 fL (ref 78.0–100.0)
Platelets: 314 10*3/uL (ref 150–400)
RBC: 4.78 MIL/uL (ref 3.87–5.11)
WBC: 7.1 10*3/uL (ref 4.0–10.5)

## 2011-02-18 LAB — DIFFERENTIAL
Basophils Relative: 1 % (ref 0–1)
Lymphs Abs: 2.2 10*3/uL (ref 0.7–4.0)
Neutro Abs: 4.5 10*3/uL (ref 1.7–7.7)

## 2011-02-18 LAB — URINE CULTURE: Colony Count: >=100000

## 2011-02-18 LAB — BASIC METABOLIC PANEL
BUN: 9 mg/dL (ref 6–23)
Calcium: 9.6 mg/dL (ref 8.4–10.5)
Creatinine, Ser: 0.84 mg/dL (ref 0.4–1.2)
GFR calc non Af Amer: >60 mL/min (ref >60)
Glucose, Bld: 445 mg/dL — ABNORMAL HIGH (ref 70–99)
Potassium: 4 mEq/L (ref 3.5–5.1)
Sodium: 133 mEq/L — ABNORMAL LOW (ref 135–145)

## 2011-02-18 LAB — URINE MICROSCOPIC-ADD ON

## 2011-02-18 LAB — GLUCOSE, CAPILLARY

## 2011-02-25 LAB — DIFFERENTIAL
Basophils Absolute: 0 10*3/uL (ref 0.0–0.1)
Basophils Relative: 0 % (ref 0–1)
Eosinophils Absolute: 0.2 10*3/uL (ref 0.0–0.7)
Eosinophils Relative: 2 % (ref 0–5)
Lymphocytes Relative: 30 % (ref 12–46)
Neutro Abs: 7.4 10*3/uL (ref 1.7–7.7)
Neutrophils Relative %: 65 % (ref 43–77)

## 2011-02-25 LAB — CBC
Hemoglobin: 12.9 g/dL (ref 12.0–15.0)
MCV: 93.3 fL (ref 78.0–100.0)
Platelets: 403 10*3/uL — ABNORMAL HIGH (ref 150–400)
RBC: 4.17 MIL/uL (ref 3.87–5.11)
RDW: 15.2 % (ref 11.5–15.5)
WBC: 11.4 10*3/uL — ABNORMAL HIGH (ref 4.0–10.5)

## 2011-02-25 LAB — BASIC METABOLIC PANEL
CO2: 29 mEq/L (ref 19–32)
Calcium: 9.9 mg/dL (ref 8.4–10.5)
Creatinine, Ser: 0.78 mg/dL (ref 0.4–1.2)

## 2011-02-25 LAB — POCT CARDIAC MARKERS
CKMB, poc: <1 ng/mL — ABNORMAL LOW (ref 1.0–8.0)
Myoglobin, poc: 75.5 ng/mL (ref 12–200)

## 2011-02-25 LAB — URINALYSIS, ROUTINE W REFLEX MICROSCOPIC
Bilirubin Urine: NEGATIVE
Glucose, UA: NEGATIVE mg/dL
Ketones, ur: NEGATIVE mg/dL
Protein, ur: NEGATIVE mg/dL
pH: 5.5 (ref 5.0–8.0)

## 2011-02-25 LAB — URINE MICROSCOPIC-ADD ON

## 2011-03-11 ENCOUNTER — Encounter: Payer: Self-pay | Admitting: Cardiology

## 2011-03-11 ENCOUNTER — Encounter: Payer: Self-pay | Admitting: *Deleted

## 2011-03-12 ENCOUNTER — Ambulatory Visit: Payer: Self-pay | Admitting: Cardiology

## 2011-03-25 ENCOUNTER — Encounter: Payer: Self-pay | Admitting: Cardiology

## 2011-03-26 ENCOUNTER — Encounter: Payer: Self-pay | Admitting: *Deleted

## 2011-03-26 ENCOUNTER — Ambulatory Visit (INDEPENDENT_AMBULATORY_CARE_PROVIDER_SITE_OTHER): Payer: Medicaid Other | Admitting: Cardiology

## 2011-03-26 ENCOUNTER — Encounter: Payer: Self-pay | Admitting: Cardiology

## 2011-03-26 VITALS — BP 110/78 | HR 84 | Ht 66.0 in | Wt 320.0 lb

## 2011-03-26 DIAGNOSIS — R0602 Shortness of breath: Secondary | ICD-10-CM

## 2011-03-26 DIAGNOSIS — I10 Essential (primary) hypertension: Secondary | ICD-10-CM

## 2011-03-26 DIAGNOSIS — Z01810 Encounter for preprocedural cardiovascular examination: Secondary | ICD-10-CM

## 2011-03-26 DIAGNOSIS — Z01818 Encounter for other preprocedural examination: Secondary | ICD-10-CM | POA: Insufficient documentation

## 2011-03-26 DIAGNOSIS — E785 Hyperlipidemia, unspecified: Secondary | ICD-10-CM

## 2011-03-26 DIAGNOSIS — R0989 Other specified symptoms and signs involving the circulatory and respiratory systems: Secondary | ICD-10-CM

## 2011-03-26 DIAGNOSIS — G4730 Sleep apnea, unspecified: Secondary | ICD-10-CM

## 2011-03-26 NOTE — Assessment & Plan Note (Signed)
Blood pressure is well-controlled today. 

## 2011-03-26 NOTE — Assessment & Plan Note (Signed)
Patient states that she does not sleep well in general, but tries to be compliant with CPAP.

## 2011-03-26 NOTE — Progress Notes (Signed)
Clinical Summary Misty Hendrix is a 50 y.o.female referred for preoperative consultation by Dr. Mikal Hendrix. She is being considered for a cholecystectomy secondary to symptomatic cholelithiasis. Primary care physician is Dr. Khaliyah Northrop Hendrix.  She reports occasional right upper quadrant and flank discomfort, sometimes radiating up into the chest and shoulder, also reflux symptoms after meals. No clear exertional chest pain component. She does have NYHA class 2-3 dyspnea on exertion that is chronic.  She was previously followed by Pulmonary, Dr. Sherene Hendrix, with history of cough, but the patient states but this resolved following ACE inhibitor discontinuation. She denies any active wheezing or asthma exacerbations.  Recent lab work from March 2012 showed BUN 11, creatinine 1.0, AST 26, ALT 33, cholesterol 164, HDL 41, LDL 90, triglycerides 164, sodium 140, potassium 4.2, TSH 1.6, hemoglobin A1c 6.6, hemoglobin 10.6, platelets 418.  She has undergone no prior cardiac risk stratification. Only surgery under anesthesia was a cesarean section many years ago. Does have significant family history of premature cardiovascular disease.  Allergies  Allergen Reactions  . Robitussin Cold Cough+     Current outpatient prescriptions:beclomethasone (QVAR) 80 MCG/ACT inhaler, Inhale 1 puff into the lungs as needed.  , Disp: , Rfl: ;  diphenhydrAMINE (BENADRYL) 25 MG tablet, Take 25 mg by mouth every 6 (six) hours as needed.  , Disp: , Rfl: ;  DULoxetine (CYMBALTA) 60 MG capsule, Take 60 mg by mouth daily.  , Disp: , Rfl: ;  fenofibrate (TRICOR) 145 MG tablet, Take 145 mg by mouth daily.  , Disp: , Rfl:  fluconazole (DIFLUCAN) 150 MG tablet, Take 150 mg by mouth daily as needed.  , Disp: , Rfl: ;  gabapentin (NEURONTIN) 300 MG capsule, Take 300 mg by mouth 3 (three) times daily.  , Disp: , Rfl: ;  glimepiride (AMARYL) 2 MG tablet, Take 2 mg by mouth daily.  , Disp: , Rfl: ;  hydrochlorothiazide 25 MG tablet, Take 25 mg by  mouth daily.  , Disp: , Rfl:  ibuprofen (ADVIL,MOTRIN) 200 MG tablet, Take 200 mg by mouth every 6 (six) hours as needed.  , Disp: , Rfl: ;  metFORMIN (GLUCOPHAGE) 1000 MG tablet, Take 1,000 mg by mouth 2 (two) times daily with a meal.  , Disp: , Rfl: ;  NON FORMULARY, CPAP every night. , Disp: , Rfl: ;  omeprazole (PRILOSEC OTC) 20 MG tablet, Take 20 mg by mouth daily. 1/2 hour before eating breakfast , Disp: , Rfl:  simvastatin (ZOCOR) 40 MG tablet, Take 40 mg by mouth at bedtime.  , Disp: , Rfl: ;  valsartan (DIOVAN) 40 MG tablet, Take 40 mg by mouth daily.  , Disp: , Rfl: ;  DISCONTD: albuterol (VENTOLIN HFA) 108 (90 BASE) MCG/ACT inhaler, Inhale 2 puffs into the lungs every 4 (four) hours as needed.  , Disp: , Rfl: ;  DISCONTD: chlorpheniramine (CHLOR-TRIMETON) 4 MG tablet, Take 4 mg by mouth every 6 (six) hours as needed.  , Disp: , Rfl:  DISCONTD: diazepam (VALIUM) 5 MG tablet, Take 5 mg by mouth every 8 (eight) hours as needed.  , Disp: , Rfl: ;  DISCONTD: fenofibrate (TRICOR) 48 MG tablet, Take 48 mg by mouth at bedtime.  , Disp: , Rfl: ;  DISCONTD: Mometasone Furo-Formoterol Fum (DULERA) 100-5 MCG/ACT AERO, Inhale 2 puffs into the lungs 2 (two) times daily.  , Disp: , Rfl: ;  DISCONTD: pioglitazone (ACTOS) 30 MG tablet, Take 30 mg by mouth daily.  , Disp: , Rfl:  DISCONTD: traMADol (  ULTRAM) 50 MG tablet, Take 50 mg by mouth every 6 (six) hours as needed.  , Disp: , Rfl:   Past Medical History  Diagnosis Date  . Type 2 diabetes mellitus   . Essential hypertension, benign   . Mixed hyperlipidemia   . GERD (gastroesophageal reflux disease)   . Obstructive sleep apnea     CPAP  . Depression   . Asthma     Dr. Sherene Hendrix - h/o cough, better off ACE-I  . Obesity     Past Surgical History  Procedure Date  . Cesarean section 1993    Family History  Problem Relation Age of Onset  . Coronary artery disease Father   . Coronary artery disease Mother   . Cancer Brother     Throat  . COPD  Paternal Aunt   . Asthma Maternal Uncle     Social History Misty Hendrix reports that she quit smoking about 7 years ago. Her smoking use included Cigarettes. She has a 14 pack-year smoking history. She has never used smokeless tobacco. Misty Hendrix reports that she does not drink alcohol.  Review of Systems No palpitations, dizziness, syncope. Reports compliance with CPAP. States that she does not sleep well. No frank orthopnea or PND. Occasional lower extremity edema, not progressive. Reports reflux symptoms. No melena or hematochezia. Otherwise reviewed and negative.  Physical Examination Filed Vitals:   03/26/11 0908  BP: 110/78  Pulse: 84  Morbidly obese woman in no acute distress. HEENT: Conjunctiva and lids normal, oropharynx clear. Neck: Supple, increased girth, no obvious elevated JVP or bruits, no thyromegaly. Lungs: Diminished breath sounds but clear. Cardiac: Regular rate and rhythm, no S3 gallop, distant heart sounds. Abdomen: Obese, nontender, bowel sounds present. Skin: Warm and dry. Musculoskeletal: No kyphosis. Extremities: Trace ankle edema, distal pulses one plus. Neuropsychiatric: Alert and oriented x3, affect appropriate.   ECG Normal sinus rhythm at 91 with nonspecific T wave changes.   Problem List and Plan

## 2011-03-26 NOTE — Assessment & Plan Note (Signed)
We discussed continued efforts at weight loss. She has lost approximately 30 pounds over the last few months.

## 2011-03-26 NOTE — Assessment & Plan Note (Signed)
Followed by Dr. Butler. 

## 2011-03-26 NOTE — Patient Instructions (Signed)
Your physician has requested that you have an echocardiogram. Echocardiography is a painless test that uses sound waves to create images of your heart. It provides your doctor with information about the size and shape of your heart and how well your heart's chambers and valves are working. This procedure takes approximately one hour. There are no restrictions for this procedure. Lexiscan cardiolite stress test - 2 day protocol  If the results of your test are normal or stable, you will receive a letter.  If they are abnormal, the nurse will contact you by phone.

## 2011-03-26 NOTE — Assessment & Plan Note (Signed)
Followed by Dr. Charm Barges. Patient states that her blood sugars are coming under better control, and she is trying to focus on weight loss.

## 2011-03-26 NOTE — Assessment & Plan Note (Signed)
Chronic, nonprogressive. I suspect this is multifactorial in the setting of hypertension, diabetes mellitus, morbid obesity, some component of asthma. Further ischemic workup is also being arranged in light of pending surgery.

## 2011-03-26 NOTE — Assessment & Plan Note (Signed)
Patient being considered for elective cholecystectomy, laparoscopic with potential conversion to an open procedure. Resting ECG is nonspecific. Patient has cardiac risk factors including morbid obesity, hypertension, hyperlipidemia, type 2 diabetes mellitus, also family history of premature cardiovascular disease. She has undergone no prior cardiac risk stratification. Plan is to proceed with a 2 day Cardiolite on medical therapy, also 2-D echocardiogram. Depending on the results we can make final recommendations.

## 2011-04-15 ENCOUNTER — Telehealth: Payer: Self-pay | Admitting: *Deleted

## 2011-04-15 NOTE — Telephone Encounter (Signed)
Pt was scheduled for Lexiscan Cardiolite (2 day protocol d/t wt) and Echo for 5/17. Pt was sick and didn't have transportation to have test done. Pt also states she didn't have minutes on her phone to be able call to cancel. She states she is trying to arrange transportation to go to [place] to re-apply for her Medicaid. Once she does this and gets Medicaid, she will call to r/s tests.

## 2011-12-01 ENCOUNTER — Emergency Department (HOSPITAL_COMMUNITY)
Admission: EM | Admit: 2011-12-01 | Discharge: 2011-12-01 | Disposition: A | Discharge status: Home / Self Care | Payer: Medicaid Other | Attending: Emergency Medicine | Admitting: Emergency Medicine

## 2011-12-01 ENCOUNTER — Encounter (HOSPITAL_COMMUNITY): Payer: Self-pay | Admitting: *Deleted

## 2011-12-01 DIAGNOSIS — F3289 Other specified depressive episodes: Secondary | ICD-10-CM | POA: Insufficient documentation

## 2011-12-01 DIAGNOSIS — M545 Low back pain, unspecified: Secondary | ICD-10-CM | POA: Insufficient documentation

## 2011-12-01 DIAGNOSIS — R110 Nausea: Secondary | ICD-10-CM | POA: Insufficient documentation

## 2011-12-01 DIAGNOSIS — M2550 Pain in unspecified joint: Secondary | ICD-10-CM

## 2011-12-01 DIAGNOSIS — G4733 Obstructive sleep apnea (adult) (pediatric): Secondary | ICD-10-CM | POA: Insufficient documentation

## 2011-12-01 DIAGNOSIS — M25559 Pain in unspecified hip: Secondary | ICD-10-CM | POA: Insufficient documentation

## 2011-12-01 DIAGNOSIS — M25519 Pain in unspecified shoulder: Secondary | ICD-10-CM | POA: Insufficient documentation

## 2011-12-01 DIAGNOSIS — E669 Obesity, unspecified: Secondary | ICD-10-CM | POA: Insufficient documentation

## 2011-12-01 DIAGNOSIS — Z79899 Other long term (current) drug therapy: Secondary | ICD-10-CM | POA: Insufficient documentation

## 2011-12-01 DIAGNOSIS — J45909 Unspecified asthma, uncomplicated: Secondary | ICD-10-CM | POA: Insufficient documentation

## 2011-12-01 DIAGNOSIS — E782 Mixed hyperlipidemia: Secondary | ICD-10-CM | POA: Insufficient documentation

## 2011-12-01 DIAGNOSIS — M25569 Pain in unspecified knee: Secondary | ICD-10-CM | POA: Insufficient documentation

## 2011-12-01 DIAGNOSIS — F329 Major depressive disorder, single episode, unspecified: Secondary | ICD-10-CM | POA: Insufficient documentation

## 2011-12-01 DIAGNOSIS — M25529 Pain in unspecified elbow: Secondary | ICD-10-CM | POA: Insufficient documentation

## 2011-12-01 DIAGNOSIS — K219 Gastro-esophageal reflux disease without esophagitis: Secondary | ICD-10-CM | POA: Insufficient documentation

## 2011-12-01 DIAGNOSIS — I10 Essential (primary) hypertension: Secondary | ICD-10-CM | POA: Insufficient documentation

## 2011-12-01 DIAGNOSIS — E119 Type 2 diabetes mellitus without complications: Secondary | ICD-10-CM | POA: Insufficient documentation

## 2011-12-01 LAB — GLUCOSE, CAPILLARY: Glucose-Capillary: 89 mg/dL (ref 70–99)

## 2011-12-01 MED ORDER — MELOXICAM 7.5 MG PO TABS: ORAL_TABLET | ORAL | Status: DC

## 2011-12-01 MED ORDER — KETOROLAC TROMETHAMINE 60 MG/2ML IM SOLN
60.0000 mg | Freq: Once | INTRAMUSCULAR | Status: AC
  Administered 2011-12-01: 60 mg via INTRAMUSCULAR
  Filled 2011-12-01: qty 2

## 2011-12-01 NOTE — ED Notes (Signed)
Joint pain for years,nausea,

## 2011-12-01 NOTE — Progress Notes (Signed)
Pt in er with severe joint pain, has no income gets medications from health dept. Has no money for her prescription. Medication assistance given for mobic.pt infomed cm can only assist one time with her medication.

## 2011-12-01 NOTE — ED Notes (Signed)
Pt presents with multiple c/o : "has history of gallstones, now am nauseated" left hip/pelvic pain and right arm pain and joint pain (shoulder,arm and hip/leg joints) pt goes on to say "been running fevers".

## 2011-12-01 NOTE — ED Notes (Signed)
Social Worker at bedside discussing and assisting pt with medication purchase.

## 2011-12-01 NOTE — ED Provider Notes (Signed)
History     CSN: [id number]  Arrival date & time 12/01/11  1326   First MD Initiated Contact with Patient 12/01/11 1348      Chief Complaint  Patient presents with  . Hip Pain    (Consider location/radiation/quality/duration/timing/severity/associated sxs/prior treatment) HPI Comments: Patient c/o joint pains to both knees, hips, elbows and shoulders.  States she has hx of arthritis and takes Motrin with mild to moderate relief until recently.  States at times, the level of pain causes her to become nauseated.  She denies fever, joint swelling, rash, abd or chest pain or recent injury.  States she goes to the health dept for her medical care but currently does not have the funds to go.    Patient is a 51 y.o. female presenting with knee pain. The history is provided by the patient. No language interpreter was used.  Knee Pain This is a chronic problem. The current episode started more than 1 year ago. The problem occurs intermittently. The problem has been gradually worsening. Associated symptoms include arthralgias and nausea. Pertinent negatives include no abdominal pain, chest pain, chills, congestion, coughing, fever, headaches, joint swelling, neck pain, numbness, rash, sore throat, visual change, vomiting or weakness. The symptoms are aggravated by bending, standing, walking and twisting. She has tried NSAIDs for the symptoms. The treatment provided mild relief.    Past Medical History  Diagnosis Date  . Type 2 diabetes mellitus   . Essential hypertension, benign   . Mixed hyperlipidemia   . GERD (gastroesophageal reflux disease)   . Obstructive sleep apnea     CPAP  . Depression   . Asthma     Dr. Sherene Sires - h/o cough, better off ACE-I  . Obesity   . Diabetes mellitus     Past Surgical History  Procedure Date  . Cesarean section 1993    Family History  Problem Relation Age of Onset  . Coronary artery disease Father   . Coronary artery disease Mother   . Cancer Brother      Throat  . COPD Paternal Aunt   . Asthma Maternal Uncle     History  Substance Use Topics  . Smoking status: Former Smoker -- 0.5 packs/day for 28 years    Types: Cigarettes    Quit date: 11/16/2003  . Smokeless tobacco: Never Used  . Alcohol Use: No     None since 1991    OB History    Grav Para Term Preterm Abortions TAB SAB Ect Mult Living                  Review of Systems  Constitutional: Negative for fever, chills and appetite change.  HENT: Negative for congestion, sore throat and neck pain.   Respiratory: Negative for cough and chest tightness.   Cardiovascular: Negative for chest pain.  Gastrointestinal: Positive for nausea. Negative for vomiting, abdominal pain and blood in stool.  Genitourinary: Negative for dysuria.  Musculoskeletal: Positive for arthralgias. Negative for joint swelling.  Skin: Negative.  Negative for rash.  Neurological: Negative for dizziness, facial asymmetry, weakness, numbness and headaches.  Psychiatric/Behavioral: Negative for confusion.  All other systems reviewed and are negative.    Allergies  Robitussin cold cough+ and Benicar  Home Medications   Current Outpatient Rx  Name Route Sig Dispense Refill  . DULOXETINE HCL 60 MG PO CPEP Oral Take 60 mg by mouth daily.      Marland Kitchen ESOMEPRAZOLE MAGNESIUM 40 MG PO CPDR Oral Take 40 mg  by mouth daily before breakfast.    . FENOFIBRATE 145 MG PO TABS Oral Take 145 mg by mouth daily.      Marland Kitchen GABAPENTIN 300 MG PO CAPS Oral Take 300 mg by mouth 3 (three) times daily.      Marland Kitchen ROSUVASTATIN CALCIUM 20 MG PO TABS Oral Take 20 mg by mouth daily.    Marland Kitchen SITAGLIPTIN-METFORMIN HCL 50-1000 MG PO TABS Oral Take 1 tablet by mouth 2 (two) times daily.     Marland Kitchen VALSARTAN 40 MG PO TABS Oral Take 40 mg by mouth daily.      . BECLOMETHASONE DIPROPIONATE 80 MCG/ACT IN AERS Inhalation Inhale 1 puff into the lungs as needed. For shortness of breath    . DIPHENHYDRAMINE HCL 25 MG PO TABS Oral Take 25 mg by mouth every  6 (six) hours as needed. For allergies    . FLUCONAZOLE 150 MG PO TABS Oral Take 150 mg by mouth daily as needed. For yeast    . IBUPROFEN 200 MG PO TABS Oral Take 200 mg by mouth every 6 (six) hours as needed. For pain      BP 130/61  Pulse 92  Temp(Src) 99.3 F (37.4 C) (Oral)  Resp 18  Ht 5\' 7"  (1.702 m)  Wt 297 lb (134.718 kg)  BMI 46.52 kg/m2  SpO2 98%  Physical Exam  Nursing note and vitals reviewed. Constitutional: She is oriented to person, place, and time. She appears well-developed and well-nourished. No distress.       obese  HENT:  Head: Normocephalic and atraumatic.  Mouth/Throat: Oropharynx is clear and moist.  Neck: Normal range of motion. Neck supple. No thyromegaly present.  Cardiovascular: Normal rate, regular rhythm and normal heart sounds.   No murmur heard. Pulmonary/Chest: Effort normal and breath sounds normal.  Abdominal: Soft. She exhibits no distension. There is no tenderness. There is no CVA tenderness.  Musculoskeletal: She exhibits tenderness. She exhibits no edema.       Right elbow: She exhibits normal range of motion, no swelling, no effusion, no deformity and no laceration. tenderness found. Lateral epicondyle tenderness noted.       Left hip: She exhibits tenderness and bony tenderness. She exhibits normal range of motion, normal strength, no swelling, no crepitus, no deformity and no laceration.       Lumbar back: She exhibits tenderness. She exhibits normal range of motion, no swelling, no edema, no spasm and normal pulse.       Back:  Lymphadenopathy:    She has no cervical adenopathy.  Neurological: She is alert and oriented to person, place, and time. She exhibits normal muscle tone. Coordination normal.  Skin: Skin is warm and dry.  Psychiatric: She has a normal mood and affect.    ED Course  Procedures (including critical care time)  Results for orders placed during the hospital encounter of 12/01/11  GLUCOSE, CAPILLARY       Component Value Range   Glucose-Capillary 89  70 - 99 (mg/dL)   Comment 1 Notify RN           MDM    Patient is ambulatory.  No focal neuro deficits on exam.  Moves all extremities well.  Non-toxic appearing.  Smiles, Vitals stable.  Pt requested to speak with someone regarding financial assistance in paying for her prescriptions.  I have contacted Marin Olp and she agrees to come speak with the patient.    Patient agrees to f/u with the HD or to return  here if sx's worsen.  Patient / Family / Caregiver understand and agree with initial ED impression and plan with expectations set for ED visit. Pt feels improved after observation and/or treatment in ED. Pt stable in ED with no significant deterioration in condition.           Misty Hendrix L. Marika Mahaffy, [place] 12/04/11 1444

## 2011-12-06 NOTE — ED Provider Notes (Signed)
Medical screening examination/treatment/procedure(s) were performed by non-physician practitioner and as supervising physician I was immediately available for consultation/collaboration.  Nicoletta Dress. Colon Branch, MD 12/06/11 1026

## 2012-08-29 ENCOUNTER — Other Ambulatory Visit (HOSPITAL_COMMUNITY): Payer: Self-pay | Admitting: Physician Assistant

## 2012-08-29 ENCOUNTER — Ambulatory Visit (HOSPITAL_COMMUNITY)
Admission: RE | Admit: 2012-08-29 | Discharge: 2012-08-29 | Disposition: A | Discharge status: Home / Self Care | Payer: Medicaid Other | Source: Ambulatory Visit | Attending: Physician Assistant | Admitting: Physician Assistant

## 2012-08-29 DIAGNOSIS — R209 Unspecified disturbances of skin sensation: Secondary | ICD-10-CM | POA: Insufficient documentation

## 2012-08-29 DIAGNOSIS — M79603 Pain in arm, unspecified: Secondary | ICD-10-CM

## 2012-08-29 DIAGNOSIS — M79609 Pain in unspecified limb: Secondary | ICD-10-CM | POA: Insufficient documentation

## 2012-09-12 ENCOUNTER — Ambulatory Visit (HOSPITAL_COMMUNITY)
Admission: RE | Admit: 2012-09-12 | Discharge: 2012-09-12 | Disposition: A | Discharge status: Home / Self Care | Payer: Medicaid Other | Source: Ambulatory Visit | Attending: *Deleted | Admitting: *Deleted

## 2012-09-12 DIAGNOSIS — M542 Cervicalgia: Secondary | ICD-10-CM | POA: Insufficient documentation

## 2012-09-12 DIAGNOSIS — M6281 Muscle weakness (generalized): Secondary | ICD-10-CM | POA: Insufficient documentation

## 2012-09-12 DIAGNOSIS — E119 Type 2 diabetes mellitus without complications: Secondary | ICD-10-CM | POA: Insufficient documentation

## 2012-09-12 DIAGNOSIS — M5412 Radiculopathy, cervical region: Secondary | ICD-10-CM | POA: Insufficient documentation

## 2012-09-12 DIAGNOSIS — M79609 Pain in unspecified limb: Secondary | ICD-10-CM | POA: Insufficient documentation

## 2012-09-12 DIAGNOSIS — I10 Essential (primary) hypertension: Secondary | ICD-10-CM | POA: Insufficient documentation

## 2012-09-12 DIAGNOSIS — IMO0001 Reserved for inherently not codable concepts without codable children: Secondary | ICD-10-CM | POA: Insufficient documentation

## 2012-09-12 NOTE — Evaluation (Signed)
Physical Therapy Evaluation/MEDICAID  Patient Details  Name: Misty Hendrix MRN: [id number] Date of Birth: 1961-02-12  Today's Date: 09/12/2012 Time: 1108-1140 PT Time Calculation (min): 32 min Charges: 1 eval  Visit#: 1  of 4   Re-eval:   Assessment Diagnosis: Cervical Pain w/radiculopathy.   Next MD Visit: Prudy Feeler -  Prior Therapy: None  Authorization: Medicaid  Authorization Time Period:    Authorization Visit#: 1  of 4    Past Medical History:  Past Medical History  Diagnosis Date  . Type 2 diabetes mellitus   . Essential hypertension, benign   . Mixed hyperlipidemia   . GERD (gastroesophageal reflux disease)   . Obstructive sleep apnea     CPAP  . Depression   . Asthma     Dr. Sherene Sires - h/o cough, better off ACE-I  . Obesity   . Diabetes mellitus    Past Surgical History:  Past Surgical History  Procedure Date  . Cesarean section 1993    Subjective Symptoms/Limitations: see hx section for PMH Pertinent History: Pt is referred to PT for cervical pain with B UE radiculopathy.  She reports that it has been going on for years, however lately it has been progressivly worse.  She has difficulty sleeping. She is R handed and feels it is weaker and is starting to use her L hand more. Her c/co's are tingling into her arms which is worse in the elbow region, pain and difficulty using predominant hand  Special Tests: + Bakody's, + Spurlings Compression, + Cervical Distraction, + ULTT tests BUE Pain Assessment Currently in Pain?: Yes Pain Score:   6 (Range: 6-8/10) Pain Location: Arm Pain Orientation: Right;Left Pain Type: Chronic pain Pain Relieving Factors: sitting her arms crossed Effect of Pain on Daily Activities: has reading, sleeping.   Prior Functioning  Prior Function Comments: She enjoys walking for exercise  Sensation/Coordination/Flexibility/Functional Tests Functional Tests Functional Tests: NDI: 36%  Assessment RUE Strength Right Shoulder  Flexion: 3+/5 Right Shoulder Extension: 4/5 Right Shoulder ABduction: 3+/5 Right Shoulder Internal Rotation: 3+/5 Right Shoulder External Rotation: 3+/5 Gross Grasp: Impaired LUE Strength Left Shoulder Flexion: 3+/5 Left Shoulder Extension: 4/5 Left Shoulder ABduction: 3+/5 Left Shoulder Internal Rotation: 3+/5 Left Shoulder External Rotation: 3+/5 Gross Grasp: Impaired Cervical AROM Overall Cervical AROM Comments: WNL, at end range increased pain, most with flexion Palpation Palpation: without spasms or pain and tenderness to cervical region.  Pain and tenderness to B elbow region and musculature.   Mobility/Balance  Posture/Postural Control Posture/Postural Control: Postural limitations Postural Limitations: upper cross syndrome   Exercise/Treatments Seated Exercises Neck Retraction: 5 reps Postural Training: Discussed and had pt demonstrate proper seated posture Other Seated Exercise: Scap retraction x10  Manual Therapy Manual Therapy: Other (comment) Other Manual Therapy: Cervical Traction x8 minutes w/reports of decreased pain and tingling to BUE  Physical Therapy Assessment and Plan PT Assessment and Plan Clinical Impression Statement: Pt is a 51 year old female referred to PT for cervical pain w/BUE radiculopathy which improves after manual distraction.   Pt will benefit from skilled therapeutic intervention in order to improve on the following deficits: Pain;Decreased strength;Decreased coordination;Improper body mechanics Rehab Potential: Fair Clinical Impairments Affecting Rehab Potential: secondary to insurance limations.  PT Duration:  (3 visits per insurance) PT Treatment/Interventions: Therapeutic activities;Therapeutic exercise;Manual techniques;Modalities;Neuromuscular re-education;Patient/family education PT Plan: Focus on HEP for cervical spine: T-bands, x-v, w-backs, corner stretch, UT stretch, levator stretch.  Continue with hand strengthening activities  to improve functional grip.  End each session  with cervical traction to decrease radicular symptoms. Continue to encourage appropriate posture.     Goals Home Exercise Program Pt will Perform Home Exercise Program: Independently PT Goal: Perform Home Exercise Program - Progress: Goal set today PT Short Term Goals Time to Complete Short Term Goals: 4 weeks PT Short Term Goal 1: Pt will report pain range from 3-6/10 and a decrease in radicular pain by 50% for improved QOL PT Short Term Goal 2: Pt will improve cervical and shoulder strength to [facility] in order to tolerate sitting x15 minutes with appropriate posture w/min cueing.  PT Short Term Goal 3: Pt will improve her NDI to less than 30 for improved QOL.   Problem List Patient Active Problem List  Diagnosis  . HYPERLIPIDEMIA  . Essential hypertension, benign  . ASTHMA  . SLEEP APNEA  . COUGH  . Preoperative evaluation to rule out surgical contraindication  . Dyspnea on exertion  . Morbid obesity  . Type II or unspecified type diabetes mellitus without mention of complication, uncontrolled  . Cervicalgia  . Pain in limb   PT Plan of Care PT Home Exercise Plan: see scanned document.  PT Patient Instructions: Discussed importance of postural training. Discussed insurance limitations Consulted and Agree with Plan of Care: Patient  Annett Fabian, PT 09/12/2012, 11:50 AM  INITIAL EVALUATION  Physical Therapy     Patient Name: Misty Hendrix Date Of Birth: 08-Dec-1960  Guardian Name: N/A Treatment ICD-9 Code: 7295  Address: [address] Date of Evaluation: 09/12/2012  [place], [place] [address] Requested Dates of Service: 09/12/2012 - 10/10/2012       Therapy History: No known therapy for this problem  Reason For Referral: Recipient has a new injury, disease or condition  Prior Level of Function: Independent/Modified Independent with all ADLs (OT/PT) or Audition, Communication, Voice and/or Swallowing Skills (ST/AUD)  Additional  Medical History: Subjective Symptoms/Limitations: see hx section for PMH Pertinent History: Pt is referred to PT for cervical pain with B UE radiculopathy. She reports that it has been going on for years, however lately it has been progressivly worse. She has difficulty sleeping. She is R handed and feels it is weaker and is starting to use her L hand more. Her c/co's are tingling into her arms which is worse in the elbow region, pain and difficulty using predominant hand Special Tests: + Bakody's, + Spurlings Compression, + Cervical Distraction, + ULTT tests BUE Pain Assessment Currently in Pain?: Yes Pain Score: 6 (Range: 6-8/10) Pain Location: Arm Pain Orientation: Right;Left Pain Type: Chronic pain Pain Relieving Factors: sitting her arms crossed Effect of Pain on Daily Activities: has reading, sleeping. Functional Tests: NDI: 36% Past Medical History .Type 2 diabetes mellitus .Essential hypertension, benign .Mixed hyperlipidemia .GERD (gastroesophageal reflux disease) .Obstructive sleep apnea CPAP .Depression .Asthma Dr. Sherene Sires - c/o cough, better off ACE-I .Obesity .Diabetes mellitus Past Surgical History: Procedure Date .Cesarean section 1993 OBJECTIVE: Postural Limitations: upper cross syndrome Cervical AROM Overall Cervical AROM Comments: WNL, at end range increased pain, most with flexion Palpation Palpation: without spasms or pain and tenderness to cervical region. Pain and tenderness to B elbow region and musculature.   Prematurity: N/A  Severity Level: N/A       Treatment Goals:  1. Goal: Pt will Perform Home Exercise Program: Independently  Baseline: Education  Duration: 4 Week(s)  2. Goal: Pt will report pain range from 3-6/10 and a decrease in radicular pain by 50% for improved QOL  Baseline: Pain Range: 6-8/10  Constant tingling and numbness  Duration: 4 Week(s)  3. Goal: Pt will improve cervical and shoulder strength to [facility] in order to tolerate sitting x15 minutes with appropriate posture  w/min cueing.  Baseline: unable to maintain independent posture. Requires max cueing using TC and VC RUE Strength Right Shoulder Flexion: 3+/5 Right Shoulder Extension: 4/5 Right Shoulder ABduction: 3+/5 Right Shoulder Internal Rotation: 3+/5 Right Shoulder External Rotation: 3+/5 Gross Grasp: Impaired LUE Strength Left Shoulder Flexion: 3+/5 Left Shoulder Extension: 4/5 Left Shoulder ABduction: 3+/5 Left Shoulder Internal Rotation: 3+/5 Left Shoulder External Rotation: 3+/5 Gross Grasp: Impaired  Duration: 4 Week(s)  Goal: Pt will improve her NDI to less than 30 for improved QOL.  Baseline: NDI 36%  Duration: 4 Week(s)         Treatment Frequency/Duration:  2x/week for 4 weeks  Units per visit: N/A    Additional Information: Clinical Impression Statement: Pt is a 51 year old female referred to PT for cervical pain w/BUE radiculopathy which improves after manual distraction. Pt will benefit from skilled therapeutic intervention in order to improve on the following deficits: Pain;Decreased strength;Decreased coordination;Improper body mechanics Rehab Potential: Fair Clinical Impairments Affecting Rehab Potential: secondary to insurance limations. PT Treatment/Interventions: Therapeutic activities;Therapeutic exercise;Manual techniques;Modalities;Neuromuscular re-education;Patient/family education Discussed importance of postural training. Discussed insurance limitations           Therapist Signature  Date Physician Signature  Date    Annett Fabian       Therapist Name  Physician Name   Refer to the Review Status page for current case status

## 2012-09-19 ENCOUNTER — Ambulatory Visit (HOSPITAL_COMMUNITY)
Admission: RE | Admit: 2012-09-19 | Discharge: 2012-09-19 | Disposition: A | Discharge status: Home / Self Care | Payer: Medicaid Other | Source: Ambulatory Visit | Attending: *Deleted | Admitting: *Deleted

## 2012-09-19 DIAGNOSIS — I10 Essential (primary) hypertension: Secondary | ICD-10-CM | POA: Insufficient documentation

## 2012-09-19 DIAGNOSIS — IMO0001 Reserved for inherently not codable concepts without codable children: Secondary | ICD-10-CM | POA: Insufficient documentation

## 2012-09-19 DIAGNOSIS — E119 Type 2 diabetes mellitus without complications: Secondary | ICD-10-CM | POA: Insufficient documentation

## 2012-09-19 DIAGNOSIS — M5412 Radiculopathy, cervical region: Secondary | ICD-10-CM | POA: Insufficient documentation

## 2012-09-19 DIAGNOSIS — M542 Cervicalgia: Secondary | ICD-10-CM | POA: Insufficient documentation

## 2012-09-19 DIAGNOSIS — M6281 Muscle weakness (generalized): Secondary | ICD-10-CM | POA: Insufficient documentation

## 2012-09-19 NOTE — Progress Notes (Signed)
Physical Therapy Treatment Patient Details  Name: Misty Hendrix MRN: [id number] Date of Birth: 06-23-1961  Today's Date: 09/19/2012 Time: 1104-1210 PT Time Calculation (min): 66 min  Visit#: 2  of 4   Re-eval:    Charge: therex 40', traction 17'  Authorization: Medicaid  Authorization Time Period:    Authorization Visit#: 2  of 4    Subjective: Symptoms/Limitations Symptoms: Pt stated she is compliant with HEP and has no questions for technique.  Reported radicular pain B shoulders down to elbows. 5/10 today. Pain Assessment Currently in Pain?: Yes Pain Score:   5 Pain Location: Neck Pain Orientation: Posterior Pain Radiating Towards: B shoulders to elbow  Objective:   Exercise/Treatments Stretches Upper Trapezius Stretch: 2 reps;30 seconds Levator Stretch: 1 rep;30 seconds Corner Stretch: 3 reps;30 seconds Theraband Exercises Scapula Retraction: 10 reps;Green Shoulder Extension: 10 reps;Green Rows: 10 reps;Green Shoulder ADduction: 10 reps;Green Shoulder External Rotation: 10 reps;Green Shoulder Internal Rotation: 10 reps;Green Seated Exercises Neck Retraction: 10 reps X to V: 5 reps W Back: 10 reps Postural Training: All sitting exercises done on edge of chair with min cueing for posture during activities   Modalities Modalities: Traction Traction Type of Traction: Cervical Max (lbs): 14 Hold Time: static Rest Time: static Time: 17'  Physical Therapy Assessment and Plan PT Assessment and Plan Clinical Impression Statement: Began treatment for reviewing HEP for proper technique and postural strengthening exercises. Pt able to demonstrate good posture throughout session with min cueing for proper form with cervical retraction and to breath.  Ended session with cervical traction with pain reduced to 3/10 and no radicular symptoms following.  Pt encouraged to drink extra water today to reduce risk of headaches with new modality. PT Plan: Focus on HEP for  cervical spine.  Continue postural strengthening activities.  Resume hand strengthening activities to improve functional grip.  End session with cervical traction to decrease radicular symptoms.      Goals    Problem List Patient Active Problem List  Diagnosis  . HYPERLIPIDEMIA  . Essential hypertension, benign  . ASTHMA  . SLEEP APNEA  . COUGH  . Preoperative evaluation to rule out surgical contraindication  . Dyspnea on exertion  . Morbid obesity  . Type II or unspecified type diabetes mellitus without mention of complication, uncontrolled  . Cervicalgia  . Pain in limb    PT - End of Session Activity Tolerance: Patient tolerated treatment well General Behavior During Session: [facility] for tasks performed Cognition: [facility] for tasks performed  GP    Juel Burrow 09/19/2012, 12:16 PM

## 2012-09-26 ENCOUNTER — Ambulatory Visit (HOSPITAL_COMMUNITY): Payer: Medicaid Other

## 2012-10-03 ENCOUNTER — Ambulatory Visit (HOSPITAL_COMMUNITY)
Admission: RE | Admit: 2012-10-03 | Discharge: 2012-10-03 | Disposition: A | Discharge status: Home / Self Care | Payer: Medicaid Other | Source: Ambulatory Visit | Attending: *Deleted | Admitting: *Deleted

## 2012-10-03 NOTE — Progress Notes (Signed)
Physical Therapy Treatment Patient Details  Name: Misty Hendrix MRN: [id number] Date of Birth: 1961-05-04  Today's Date: 10/03/2012 Time: 1103-1210 PT Time Calculation (min): 67 min  Visit#: 3  of 4   Re-eval:    Charge: therex 48', cervical traction 17'  Authorization: Medicaid  Authorization Visit#: 3  of 4    Subjective: Symptoms/Limitations Symptoms: Pt reported pain reduced folllowing last session.  Pt reported pain 5/10 R shoulder with radicular pain down to wrist, L shoulder 4/10. Pain Assessment Currently in Pain?: Yes Pain Score:   5 Pain Location: Neck Pain Orientation: Right;Left Pain Radiating Towards: R radicular pain down to wrist  Objective:   Exercise/Treatments Stretches Upper Trapezius Stretch: 3 reps;30 seconds Corner Stretch: 3 reps;30 seconds Theraband Exercises Scapula Retraction: 10 reps;Green Shoulder Extension: 10 reps;Green Rows: 10 reps;Green Other Theraband Exercises: PNF D1 10x Seated Exercises Neck Retraction: 10 reps X to V: 10 reps W Back: 10 reps Postural Training: All sitting exercises done on edge of chair with min cueing for posture during activities, tactile cueing top of head for TrA activation for posture cueing Other Seated Exercise: yellow putty: flatten, roll, pinch BUE 1 rep each  Modalities Modalities: Traction Traction Type of Traction: Cervical Max (lbs): 14 Hold Time: static Rest Time: static Time: 17'  Physical Therapy Assessment and Plan PT Assessment and Plan Clinical Impression Statement: Radicular symptoms decreased with cervical stretches and new hand strengthening exercises to improve functional grip.  Pt with good form with therex, min tactile cueing for posture with sitting exercises.  Pt reported no radicular symptoms and pain reduced at end of session following traction. PT Plan: Reassess next session per 4th visit with Medicaid insurance.  Continue with postural and hand strengthening exercises , end  session with cervical traction to decrease radicular symptoms.    Goals    Problem List Patient Active Problem List  Diagnosis  . HYPERLIPIDEMIA  . Essential hypertension, benign  . ASTHMA  . SLEEP APNEA  . COUGH  . Preoperative evaluation to rule out surgical contraindication  . Dyspnea on exertion  . Morbid obesity  . Type II or unspecified type diabetes mellitus without mention of complication, uncontrolled  . Cervicalgia  . Pain in limb    PT - End of Session Activity Tolerance: Patient tolerated treatment well General Behavior During Session: [facility] for tasks performed Cognition: [facility] for tasks performed  GP    Juel Burrow 10/03/2012, 2:07 PM

## 2012-10-10 ENCOUNTER — Ambulatory Visit (HOSPITAL_COMMUNITY): Payer: Medicaid Other | Admitting: Physical Therapy

## 2012-10-10 ENCOUNTER — Ambulatory Visit (HOSPITAL_COMMUNITY): Payer: Medicaid Other

## 2012-10-18 ENCOUNTER — Ambulatory Visit (HOSPITAL_COMMUNITY)
Admission: RE | Admit: 2012-10-18 | Discharge: 2012-10-18 | Disposition: A | Discharge status: Home / Self Care | Payer: Medicaid Other | Source: Ambulatory Visit | Attending: *Deleted | Admitting: *Deleted

## 2012-10-18 DIAGNOSIS — M5412 Radiculopathy, cervical region: Secondary | ICD-10-CM | POA: Insufficient documentation

## 2012-10-18 DIAGNOSIS — M542 Cervicalgia: Secondary | ICD-10-CM | POA: Insufficient documentation

## 2012-10-18 DIAGNOSIS — M6281 Muscle weakness (generalized): Secondary | ICD-10-CM | POA: Insufficient documentation

## 2012-10-18 DIAGNOSIS — I10 Essential (primary) hypertension: Secondary | ICD-10-CM | POA: Insufficient documentation

## 2012-10-18 DIAGNOSIS — IMO0001 Reserved for inherently not codable concepts without codable children: Secondary | ICD-10-CM | POA: Insufficient documentation

## 2012-10-18 DIAGNOSIS — E119 Type 2 diabetes mellitus without complications: Secondary | ICD-10-CM | POA: Insufficient documentation

## 2012-10-18 NOTE — Progress Notes (Signed)
Physical Therapy Discharge/Treatment Patient Details  Name: Misty Hendrix MRN: [id number] Date of Birth: 12-06-1960  Today's Date: 10/18/2012 Time: 7846-9629 PT Time Calculation (min): 49 min Charges: 1 traction, 25' TE Visit#: 4  of 4   Re-eval:   Assessment Diagnosis: Cervical Pain w/radiculopathy.   Next MD Visit: Prudy Feeler -   Subjective: Symptoms/Limitations Symptoms: Pt reports that she is doing better.  She reports that she has a significant decrease in numbness to her L UE, has most weakness and difficulty with arm strength to RUE.  She wishes to continue with therapy, however does not have the funds to afford self-pay.  Discussed possibly returning in January for 3 more visits.  Pain Assessment Currently in Pain?: Yes Pain Score:   5 Pain Location: Neck Pain Orientation: Right  Precautions/Restrictions     Exercise/Treatments Mobility/Balance     Posture/Postural Control Posture/Postural Control: No significant limitations Theraband Exercises Scapula Retraction: Green;10 reps Shoulder Extension: 15 reps;Green Rows: 15 reps;Green Shoulder ADduction: 15 reps;Green Shoulder External Rotation: 10 reps;Green (BUE) Shoulder Internal Rotation: 10 reps;Green (BUE) Hand Exercises for Cervical Radiculopathy Gross Grasp: Yellow Putty x5 Pinch Grip: Yellow Putt x5 Finger ABduction: Red rubber band: full hand, individual finger x5 each RUE onle Other Hand Exercise for Cervical Radiculopathy: Yellow PuttyL Flattening Other Hand Exercise for Cervical Radiculopathy: Yellow Putty Rolls  Modalities Modalities: Traction Traction Type of Traction: Cervical Max (lbs): 14 Hold Time: static Rest Time: static Time: 17'  Physical Therapy Assessment and Plan PT Assessment and Plan Clinical Impression Statement: Misty Hendrix has attended 4 OP PT visits to address cervical radiculopathy with the following findings: met 2/4 goals, has overall significant decrease in radicular  symptoms, continues to have decreased strength to dominant hand. provided pt with putty, theraband and rubber band to continue with exercises at home.  At this time will D/C secondary to insurance limiations.  Discussed with pt to return at beginning of the year if needed.  PT Plan: D/C    Goals Home Exercise Program Pt will Perform Home Exercise Program: Independently PT Goal: Perform Home Exercise Program - Progress: Met PT Short Term Goals Time to Complete Short Term Goals: 4 weeks PT Short Term Goal 1: Pt will report pain range from 3-6/10 and a decrease in radicular pain by 50% for improved QOL PT Short Term Goal 1 - Progress: Met (Pain: 0-6/10; radicular symptoms decreased by 80%) PT Short Term Goal 2: Pt will improve cervical and shoulder strength to [facility] in order to tolerate sitting x15 minutes with appropriate posture w/min cueing.  PT Short Term Goal 2 - Progress: Partly met (10 minutes, w/appropriate posture) PT Short Term Goal 3: Pt will improve her NDI to less than 30 for improved QOL.   Problem List Patient Active Problem List  Diagnosis  . HYPERLIPIDEMIA  . Essential hypertension, benign  . ASTHMA  . SLEEP APNEA  . COUGH  . Preoperative evaluation to rule out surgical contraindication  . Dyspnea on exertion  . Morbid obesity  . Type II or unspecified type diabetes mellitus without mention of complication, uncontrolled  . Cervicalgia  . Pain in limb    PT - End of Session Activity Tolerance: Patient tolerated treatment well General Behavior During Session: [facility] for tasks performed Cognition: [facility] for tasks performed PT Plan of Care PT Home Exercise Plan: provided pt with blue tband, yellow putty and red rubber band.   Consulted and Agree with Plan of Care: Patient  GP    Noa Constante  Yariah Selvey 10/18/2012, 12:55 PM

## 2014-03-08 ENCOUNTER — Other Ambulatory Visit: Payer: Self-pay

## 2014-03-08 ENCOUNTER — Emergency Department (HOSPITAL_COMMUNITY)
Admission: EM | Admit: 2014-03-08 | Discharge: 2014-03-08 | Disposition: A | Discharge status: Home / Self Care | Payer: MEDICAID | Attending: Emergency Medicine | Admitting: Emergency Medicine

## 2014-03-08 ENCOUNTER — Encounter (HOSPITAL_COMMUNITY): Payer: Self-pay | Admitting: Emergency Medicine

## 2014-03-08 DIAGNOSIS — K219 Gastro-esophageal reflux disease without esophagitis: Secondary | ICD-10-CM | POA: Insufficient documentation

## 2014-03-08 DIAGNOSIS — J45901 Unspecified asthma with (acute) exacerbation: Secondary | ICD-10-CM | POA: Insufficient documentation

## 2014-03-08 DIAGNOSIS — Z9889 Other specified postprocedural states: Secondary | ICD-10-CM | POA: Insufficient documentation

## 2014-03-08 DIAGNOSIS — Z9981 Dependence on supplemental oxygen: Secondary | ICD-10-CM | POA: Insufficient documentation

## 2014-03-08 DIAGNOSIS — Z87891 Personal history of nicotine dependence: Secondary | ICD-10-CM | POA: Insufficient documentation

## 2014-03-08 DIAGNOSIS — F3289 Other specified depressive episodes: Secondary | ICD-10-CM | POA: Insufficient documentation

## 2014-03-08 DIAGNOSIS — IMO0002 Reserved for concepts with insufficient information to code with codable children: Secondary | ICD-10-CM | POA: Insufficient documentation

## 2014-03-08 DIAGNOSIS — Z79899 Other long term (current) drug therapy: Secondary | ICD-10-CM | POA: Insufficient documentation

## 2014-03-08 DIAGNOSIS — E782 Mixed hyperlipidemia: Secondary | ICD-10-CM | POA: Insufficient documentation

## 2014-03-08 DIAGNOSIS — I10 Essential (primary) hypertension: Secondary | ICD-10-CM | POA: Insufficient documentation

## 2014-03-08 DIAGNOSIS — F329 Major depressive disorder, single episode, unspecified: Secondary | ICD-10-CM | POA: Insufficient documentation

## 2014-03-08 DIAGNOSIS — E669 Obesity, unspecified: Secondary | ICD-10-CM | POA: Insufficient documentation

## 2014-03-08 DIAGNOSIS — F419 Anxiety disorder, unspecified: Secondary | ICD-10-CM

## 2014-03-08 DIAGNOSIS — G4733 Obstructive sleep apnea (adult) (pediatric): Secondary | ICD-10-CM | POA: Insufficient documentation

## 2014-03-08 DIAGNOSIS — F411 Generalized anxiety disorder: Secondary | ICD-10-CM | POA: Insufficient documentation

## 2014-03-08 DIAGNOSIS — E119 Type 2 diabetes mellitus without complications: Secondary | ICD-10-CM | POA: Insufficient documentation

## 2014-03-08 LAB — CBG MONITORING, ED: GLUCOSE-CAPILLARY: 124 mg/dL — AB (ref 70–99)

## 2014-03-08 MED ORDER — LORAZEPAM 2 MG/ML IJ SOLN
0.5000 mg | Freq: Once | INTRAMUSCULAR | Status: AC
  Administered 2014-03-08: 0.5 mg via INTRAMUSCULAR
  Filled 2014-03-08: qty 1

## 2014-03-08 NOTE — ED Notes (Addendum)
Per patient having a panic attack. Patient reports hx of panic attacks but states "I've never had one this bad." Per patient started after getting a stressful call from attorney about "housing situation." Patient denies any pain reports nausea, chest tightness, tingling in both hands, and shortness of breath.

## 2014-03-08 NOTE — Discharge Instructions (Signed)
[facility]. Return to the ED if you feel you may do something to hurt yourself or someone else.    Panic Attacks Panic attacks are sudden, short-livedsurges of severe anxiety, fear, or discomfort. They may occur for no reason when you are relaxed, when you are anxious, or when you are sleeping. Panic attacks may occur for a number of reasons:   Healthy people occasionally have panic attacks in extreme, life-threatening situations, such as war or natural disasters. Normal anxiety is a protective mechanism of the body that helps us react to danger (fight or flight response).  Panic attacks are often seen with anxiety disorders, such as panic disorder, social anxiety disorder, generalized anxiety disorder, and phobias. Anxiety disorders cause excessive or uncontrollable anxiety. They may interfere with your relationships or other life activities.  Panic attacks are sometimes seen with other mental illnesses such as depression and posttraumatic stress disorder.  Certain medical conditions, prescription medicines, and drugs of abuse can cause panic attacks. SYMPTOMS  Panic attacks start suddenly, peak within 20 minutes, and are accompanied by four or more of the following symptoms:  Pounding heart or fast heart rate (palpitations).  Sweating.  Trembling or shaking.  Shortness of breath or feeling smothered.  Feeling choked.  Chest pain or discomfort.  Nausea or strange feeling in your stomach.  Dizziness, lightheadedness, or feeling like you will faint.  Chills or hot flushes.  Numbness or tingling in your lips or hands and feet.  Feeling that things are not real or feeling that you are not yourself.  Fear of losing control or going crazy.  Fear of dying. Some of these symptoms can mimic serious medical conditions. For example, you may think you are having a heart attack. Although panic attacks can be very scary, they are not life  threatening. DIAGNOSIS  Panic attacks are diagnosed through an assessment by your health care provider. Your health care provider will ask questions about your symptoms, such as where and when they occurred. Your health care provider will also ask about your medical history and use of alcohol and drugs, including prescription medicines. Your health care provider may order blood tests or other studies to rule out a serious medical condition. Your health care provider may refer you to a mental health professional for further evaluation. TREATMENT   Most healthy people who have one or two panic attacks in an extreme, life-threatening situation will not require treatment.  The treatment for panic attacks associated with anxiety disorders or other mental illness typically involves counseling with a mental health professional, medicine, or a combination of both. Your health care provider will help determine what treatment is best for you.  Panic attacks due to physical illness usually goes away with treatment of the illness. If prescription medicine is causing panic attacks, talk with your health care provider about stopping the medicine, decreasing the dose, or substituting another medicine.  Panic attacks due to alcohol or drug abuse goes away with abstinence. Some adults need professional help in order to stop drinking or using drugs. HOME CARE INSTRUCTIONS   Take all your medicines as prescribed.   Check with your health care provider before starting new prescription or over-the-counter medicines.  Keep all follow up appointments with your health care provider. SEEK MEDICAL CARE IF:  You are not able to take your medicines as prescribed.  Your symptoms do not improve or get worse. SEEK IMMEDIATE MEDICAL CARE IF:   You experience panic  attack symptoms that are different than your usual symptoms.  You have serious thoughts about hurting yourself or others.  You are taking medicine for  panic attacks and have a serious side effect. MAKE SURE YOU:  Understand these instructions.  Will watch your condition.  Will get help right away if you are not doing well or get worse. Document Released: 11/01/2005 Document Revised: 08/22/2013 Document Reviewed: 06/15/2013 [facility] Patient Information 2014 [employer] [place].

## 2014-03-08 NOTE — ED Provider Notes (Signed)
CSN: [id number]     Arrival date & time 03/08/14  1503 History  This chart was scribed for Ward Givens, MD by Dorothey Baseman, ED Scribe. This patient was seen in room APA19/APA19 and the patient's care was started at 4:09 PM.    Chief Complaint  Patient presents with  . Panic Attack   The history is provided by the patient. No language interpreter was used.   HPI Comments: Misty Hendrix is a 53 y.o. Female with a history of anxiety and depression who presents to the Emergency Department complaining of a panic attack onset PTA, including mild upper central chest tightness, dizziness, lightheadedness, and tremors and shaking. She had mild nausea. She states that this episode was more severe than her prior anxiety, but that her symptoms have been gradually improving. Patient reports that she has recently been experiencing some stress at home secondary to a possible, pending eviction and that her symptoms today presented after an upsetting phone call with her lawyer. She states that she has been evaluated at [facility] in the past, last instance was a little over a year ago, for her anxiety and depression. She states that she stopped taking Cymbalta a few months ago because she did not like its effects, but that her anxiety and depression has been well-controlled with therapy and lifestyle changes. Patient also clonazepam 1 mg PRN for her anxiety prescribed by Prudy Feeler. She denies suicidal or homicidal ideations. She denies feeling depressed, states she is disappointed.  Patient has allergies to dextromethorphan-guaifenesin and Benicar. Patient has a history of type II DM, HTN, hyperlipidemia, obesity, asthma, and obstructive sleep apnea.   PCP Prudy Feeler  Past Medical History  Diagnosis Date  . Type 2 diabetes mellitus   . Essential hypertension, benign   . Mixed hyperlipidemia   . GERD (gastroesophageal reflux disease)   . Obstructive sleep apnea     CPAP  . Depression   . Asthma     Dr. Sherene Sires -  h/o cough, better off ACE-I  . Obesity   . Diabetes mellitus    Past Surgical History  Procedure Laterality Date  . Cesarean section  1993   Family History  Problem Relation Age of Onset  . Coronary artery disease Father   . Coronary artery disease Mother   . Cancer Brother     Throat  . COPD Paternal Aunt   . Asthma Maternal Uncle    History  Substance Use Topics  . Smoking status: Former Smoker -- 0.50 packs/day for 28 years    Types: Cigarettes    Quit date: 11/16/2003  . Smokeless tobacco: Never Used  . Alcohol Use: No     Comment: None since 1991  lives alone babysits her grandson   OB History   Grav Para Term Preterm Abortions TAB SAB Ect Mult Living                 Review of Systems  Respiratory: Positive for chest tightness.   Neurological: Positive for dizziness, tremors and light-headedness.  Psychiatric/Behavioral: Negative for suicidal ideas.  All other systems reviewed and are negative.   Allergies  Dextromethorphan-guaifenesin and Benicar  Home Medications   Prior to Admission medications   Medication Sig Start Date End Date Taking? Authorizing Provider  meloxicam (MOBIC) 7.5 MG tablet Take 7.5 mg by mouth 2 (two) times daily with a meal.   Yes Historical Provider, MD  beclomethasone (QVAR) 80 MCG/ACT inhaler Inhale 1 puff into the lungs as  needed. For shortness of breath    Historical Provider, MD  diphenhydrAMINE (BENADRYL) 25 MG tablet Take 25 mg by mouth every 6 (six) hours as needed. For allergies    Historical Provider, MD  DULoxetine (CYMBALTA) 60 MG capsule Take 60 mg by mouth daily.      Historical Provider, MD  esomeprazole (NEXIUM) 40 MG capsule Take 40 mg by mouth daily before breakfast.    Historical Provider, MD  fenofibrate (TRICOR) 145 MG tablet Take 145 mg by mouth daily.      Historical Provider, MD  fluconazole (DIFLUCAN) 150 MG tablet Take 150 mg by mouth daily as needed. For yeast    Historical Provider, MD  gabapentin  (NEURONTIN) 300 MG capsule Take 300 mg by mouth 3 (three) times daily.      Historical Provider, MD  ibuprofen (ADVIL,MOTRIN) 200 MG tablet Take 200 mg by mouth every 6 (six) hours as needed. For pain    Historical Provider, MD  rosuvastatin (CRESTOR) 20 MG tablet Take 20 mg by mouth daily.    Historical Provider, MD  sitaGLIPtan-metformin (JANUMET) 50-1000 MG per tablet Take 1 tablet by mouth 2 (two) times daily.     Historical Provider, MD  valsartan (DIOVAN) 40 MG tablet Take 40 mg by mouth daily.      Historical Provider, MD   Triage Vitals: BP 180/94  Pulse 104  Temp(Src) 98.2 F (36.8 C) (Oral)  Resp 26  Ht 5\' 6"  (1.676 m)  Wt 297 lb (134.718 kg)  BMI 47.96 kg/m2  SpO2 99%  Vital signs normal except hypertension and tachycardia   Physical Exam  Nursing note and vitals reviewed. Constitutional: She is oriented to person, place, and time. She appears well-developed and well-nourished.  Non-toxic appearance. She does not appear ill. No distress.  Patient was tearful.   HENT:  Head: Normocephalic and atraumatic.  Right Ear: External ear normal.  Left Ear: External ear normal.  Nose: Nose normal. No mucosal edema or rhinorrhea.  Mouth/Throat: Oropharynx is clear and moist and mucous membranes are normal. No dental abscesses or uvula swelling.  Eyes: Conjunctivae and EOM are normal. Pupils are equal, round, and reactive to light.  Neck: Normal range of motion and full passive range of motion without pain. Neck supple.  Cardiovascular: Normal rate, regular rhythm and normal heart sounds.  Exam reveals no gallop and no friction rub.   No murmur heard. Pulmonary/Chest: Effort normal and breath sounds normal. No respiratory distress. She has no wheezes. She has no rhonchi. She has no rales. She exhibits no tenderness and no crepitus.  Abdominal: Soft. Normal appearance and bowel sounds are normal. She exhibits no distension. There is no tenderness. There is no rebound and no guarding.   Musculoskeletal: Normal range of motion. She exhibits no edema and no tenderness.  Moves all extremities well.   Neurological: She is alert and oriented to person, place, and time. She has normal strength. No cranial nerve deficit.  Skin: Skin is warm, dry and intact. No rash noted. No erythema. No pallor.  Psychiatric: She has a normal mood and affect. Her speech is normal and behavior is normal. Her mood appears not anxious. She expresses no homicidal and no suicidal ideation.    ED Course  Procedures (including critical care time)  DIAGNOSTIC STUDIES: Oxygen Saturation is 99% on room air, normal by my interpretation.    COORDINATION OF CARE: 3:28 PM- Ordered EKG and CBC.   4:23 PM- Will order an injection of Ativan  to manage symptoms, as patient states that she does not want oral medications and has a bottle of clonazepam at home. Discussed treatment plan with patient at bedside and patient verbalized agreement.   4:54 PM- Discussed that EKG results were normal. Patient is stable for discharge. [facility] Discussed treatment plan with patient at bedside and patient verbalized agreement.   Medications  LORazepam (ATIVAN) injection 0.5 mg (0.5 mg Intramuscular Given 03/08/14 1641)    Results for orders placed during the hospital encounter of 03/08/14  CBG MONITORING, ED      Result Value Ref Range   Glucose-Capillary 124 (*) 70 - 99 mg/dL     EKG Interpretation   Date/Time:  Friday March 08 2014 15:39:32 EDT Ventricular Rate:  105 PR Interval:  172 QRS Duration: 76 QT Interval:  346 QTC Calculation: 457 R Axis:   52 Text Interpretation:  Sinus tachycardia Otherwise normal ECG Since last  tracing rate faster Confirmed by Zaeden Lastinger  MD-I, Cyana Shook ([id number]) on 03/08/2014  6:24:19 PM      MDM   Final diagnoses:  Anxiety    Plan discharge  Devoria AlbeIva Sofie Schendel, MD, Franz DellFACEP    Naja Apperson L Shiryl Ruddy, MD 03/08/14 [phone]

## 2014-11-13 ENCOUNTER — Other Ambulatory Visit: Payer: Self-pay | Admitting: Orthopaedic Surgery

## 2014-11-13 DIAGNOSIS — M5440 Lumbago with sciatica, unspecified side: Secondary | ICD-10-CM

## 2014-11-27 ENCOUNTER — Other Ambulatory Visit: Payer: MEDICAID

## 2014-12-14 ENCOUNTER — Ambulatory Visit
Admission: RE | Admit: 2014-12-14 | Discharge: 2014-12-14 | Disposition: A | Discharge status: Home / Self Care | Payer: MEDICAID | Source: Ambulatory Visit | Attending: Orthopaedic Surgery | Admitting: Orthopaedic Surgery

## 2014-12-14 DIAGNOSIS — M5440 Lumbago with sciatica, unspecified side: Secondary | ICD-10-CM

## 2015-01-08 ENCOUNTER — Ambulatory Visit (INDEPENDENT_AMBULATORY_CARE_PROVIDER_SITE_OTHER): Payer: Self-pay | Admitting: Neurology

## 2015-01-08 ENCOUNTER — Ambulatory Visit (INDEPENDENT_AMBULATORY_CARE_PROVIDER_SITE_OTHER): Payer: Medicaid Other | Admitting: Neurology

## 2015-01-08 DIAGNOSIS — M5416 Radiculopathy, lumbar region: Secondary | ICD-10-CM

## 2015-01-08 NOTE — Progress Notes (Signed)
See procedure note.

## 2015-01-08 NOTE — Progress Notes (Addendum)
  [facility]    Provider:  Dr Lucia GaskinsAhern Referring Provider: Samuel JesterButler, Cynthia, DO Primary Care Physician:  Samuel JesterBUTLER, CYNTHIA, DO   HPI:  Misty Hendrix is a 54 y.o. female here as a referral from Dr. Charm BargesButler for low back pain. She has a PMHx of morbid obesity, HTN, DM2, neuropathy, HLD.  She reports chronic low back pain with pain that radiates into the left > right legs. Her left leg "gives out".   Summary: Nerve conduction studies were performed on the bilateral lower extremities.  Bilateral Peroneal and Tibial motor conductions were within normal limits with normal F Wave latencies.  Bilateral Sural sensory conductions were within normal limits Bilateral  H Reflex latencies were within normal limits.   EMG needle study was performed on selected left lower extremity muscles and bilateral paraspinal muscles. The left L5 paraspinal muscle showed increased spontaneous activity. The Iliopsoas, Vastus Medialis, Vastus Lateralis, Anterior Tibialis, Medial Gastrocnemius,  Biceps Femoris (long head), Gluteus Medius and Gluteus Maximus muscles were within normal limits. The left S1 and right L5/S1 paraspinal muscles were within normal limits.  Conclusion:  EMG shows acute/ongoing denervation isolated to a left lower lumbar paraspinal muscle, consistent with left-sided radiculopathy. Given the lack of similar changes in limb muscles and considerable overlap in paraspinal innervation, a more precise localization cannot be identified at this time. No evidence for large-fiber peripheral polyneuropathy. Technically difficult exam due to body habitus. Clinical correlation recommended.   Artemio Aly.    Toni Ahern, MD  [facility] [address] [place], [place] [id number]  Phone [phone] Fax [phone]

## 2015-01-08 NOTE — Procedures (Signed)
  [facility]    Provider:  Dr Lucia GaskinsAhern Referring Provider: Samuel JesterButler, Cynthia, DO Primary Care Physician:  Samuel JesterBUTLER, CYNTHIA, DO   HPI:  Misty Hendrix is a 54 y.o. female here as a referral from Dr. Charm BargesButler for low back pain. She has a PMHx of morbid obesity, HTN, DM2, neuropathy, HLD.  She reports chronic low back pain with pain that radiates into the left > right legs. Her left leg "gives out".   Summary: Nerve conduction studies were performed on the bilateral lower extremities.  Bilateral Peroneal and Tibial motor conductions were within normal limits with normal F Wave latencies.  Bilateral Sural sensory conductions were within normal limits Bilateral  H Reflex latencies were within normal limits.   EMG needle study was performed on selected left lower extremity muscles and bilateral paraspinal muscles. The left L5 paraspinal muscle showed increased spontaneous activity. The Iliopsoas, Vastus Medialis, Vastus Lateralis, Anterior Tibialis, Medial Gastrocnemius,  Biceps Femoris (long head), Gluteus Minimus and Gluteus Maximus muscles were within normal limits. The left S1 and right L5/S1 paraspinal muscles were within normal limits.  Conclusion:  EMG shows acute/ongoing denervation isolated to a left lower lumbar paraspinal muscle, consistent with left-sided radiculopathy. Given the lack of similar changes in limb muscles and considerable overlap in paraspinal innervation, a more precise localization cannot be identified at this time. No evidence for large-fiber peripheral polyneuropathy. Technically difficult exam due to body habitus. Clinical correlation recommended.    Artemio Alyoni Koleson Reifsteck, MD  [facility] [address] [place], [place] [id number]  Phone [phone] Fax [phone]

## 2015-05-10 ENCOUNTER — Encounter (HOSPITAL_COMMUNITY): Payer: Self-pay | Admitting: Emergency Medicine

## 2015-05-10 ENCOUNTER — Emergency Department (HOSPITAL_COMMUNITY): Payer: Medicaid Other

## 2015-05-10 ENCOUNTER — Emergency Department (HOSPITAL_COMMUNITY)
Admission: EM | Admit: 2015-05-10 | Discharge: 2015-05-10 | Disposition: A | Discharge status: Home / Self Care | Payer: Medicaid Other | Attending: Emergency Medicine | Admitting: Emergency Medicine

## 2015-05-10 DIAGNOSIS — J45909 Unspecified asthma, uncomplicated: Secondary | ICD-10-CM | POA: Insufficient documentation

## 2015-05-10 DIAGNOSIS — Y9389 Activity, other specified: Secondary | ICD-10-CM | POA: Diagnosis not present

## 2015-05-10 DIAGNOSIS — S79921A Unspecified injury of right thigh, initial encounter: Secondary | ICD-10-CM | POA: Insufficient documentation

## 2015-05-10 DIAGNOSIS — K219 Gastro-esophageal reflux disease without esophagitis: Secondary | ICD-10-CM | POA: Insufficient documentation

## 2015-05-10 DIAGNOSIS — F329 Major depressive disorder, single episode, unspecified: Secondary | ICD-10-CM | POA: Diagnosis not present

## 2015-05-10 DIAGNOSIS — Z9981 Dependence on supplemental oxygen: Secondary | ICD-10-CM | POA: Insufficient documentation

## 2015-05-10 DIAGNOSIS — S40011A Contusion of right shoulder, initial encounter: Secondary | ICD-10-CM | POA: Insufficient documentation

## 2015-05-10 DIAGNOSIS — E669 Obesity, unspecified: Secondary | ICD-10-CM | POA: Diagnosis not present

## 2015-05-10 DIAGNOSIS — Z87891 Personal history of nicotine dependence: Secondary | ICD-10-CM | POA: Insufficient documentation

## 2015-05-10 DIAGNOSIS — Y9289 Other specified places as the place of occurrence of the external cause: Secondary | ICD-10-CM | POA: Diagnosis not present

## 2015-05-10 DIAGNOSIS — S79922A Unspecified injury of left thigh, initial encounter: Secondary | ICD-10-CM | POA: Diagnosis not present

## 2015-05-10 DIAGNOSIS — W01198A Fall on same level from slipping, tripping and stumbling with subsequent striking against other object, initial encounter: Secondary | ICD-10-CM | POA: Insufficient documentation

## 2015-05-10 DIAGNOSIS — Y998 Other external cause status: Secondary | ICD-10-CM | POA: Diagnosis not present

## 2015-05-10 DIAGNOSIS — I10 Essential (primary) hypertension: Secondary | ICD-10-CM | POA: Diagnosis not present

## 2015-05-10 DIAGNOSIS — E782 Mixed hyperlipidemia: Secondary | ICD-10-CM | POA: Diagnosis not present

## 2015-05-10 DIAGNOSIS — S4991XA Unspecified injury of right shoulder and upper arm, initial encounter: Secondary | ICD-10-CM | POA: Diagnosis present

## 2015-05-10 DIAGNOSIS — Z791 Long term (current) use of non-steroidal anti-inflammatories (NSAID): Secondary | ICD-10-CM | POA: Insufficient documentation

## 2015-05-10 DIAGNOSIS — T148XXA Other injury of unspecified body region, initial encounter: Secondary | ICD-10-CM

## 2015-05-10 DIAGNOSIS — Z79899 Other long term (current) drug therapy: Secondary | ICD-10-CM | POA: Diagnosis not present

## 2015-05-10 DIAGNOSIS — E119 Type 2 diabetes mellitus without complications: Secondary | ICD-10-CM | POA: Diagnosis not present

## 2015-05-10 DIAGNOSIS — G4733 Obstructive sleep apnea (adult) (pediatric): Secondary | ICD-10-CM | POA: Insufficient documentation

## 2015-05-10 MED ORDER — TRAMADOL HCL 50 MG PO TABS
50.0000 mg | ORAL_TABLET | Freq: Once | ORAL | Status: AC
  Administered 2015-05-10: 50 mg via ORAL
  Filled 2015-05-10: qty 1

## 2015-05-10 MED ORDER — TRAMADOL HCL 50 MG PO TABS: 50.0000 mg | ORAL_TABLET | Freq: Four times a day (QID) | ORAL | Status: DC | PRN

## 2015-05-10 NOTE — ED Provider Notes (Signed)
CSN: [id number]     Arrival date & time 05/10/15  1105 History   First MD Initiated Contact with Patient 05/10/15 1124     Chief Complaint  Patient presents with  . Fall     (Consider location/radiation/quality/duration/timing/severity/associated sxs/prior Treatment) HPI Comments:  54 year old female, she presents after having a fall last night when she tripped on a floor board falling forward and striking her right shoulder on a soft ottoman, falling to the ground catching herself on her hands and knees. She was able to get up and walk, complains of pain in her right shoulder and in her bilateral thighs though she is able to ambulate without assistance this morning. She does have a history of arthritis, she has used arthritis cream on her back today. Pain is persistent, worse with ambulation, not associated with head injury or loss of consciousness. She denies chest pain, upper back pain, numbness or weakness.  [name] presenting with fall. The history is provided by the patient.  Fall    Past Medical History  Diagnosis Date  . Type 2 diabetes mellitus   . Essential hypertension, benign   . Mixed hyperlipidemia   . GERD (gastroesophageal reflux disease)   . Obstructive sleep apnea     CPAP  . Depression   . Asthma     Dr. Sherene Sires - h/o cough, better off ACE-I  . Obesity   . Diabetes mellitus    Past Surgical History  Procedure Laterality Date  . Cesarean section  1993   Family History  Problem Relation Age of Onset  . Coronary artery disease Father   . Coronary artery disease Mother   . Cancer Brother     Throat  . COPD Paternal Aunt   . Asthma Maternal Uncle    History  Substance Use Topics  . Smoking status: Former Smoker -- 0.50 packs/day for 28 years    Types: Cigarettes    Quit date: 11/16/2003  . Smokeless tobacco: Never Used  . Alcohol Use: No     Comment: None since 1991   OB History    No data available     Review of Systems  All  other systems reviewed and are negative.     Allergies  Dextromethorphan-guaifenesin and Benicar  Home Medications   Prior to Admission medications   Medication Sig Start Date End Date Taking? Authorizing Provider  celecoxib (CELEBREX) 200 MG capsule Take 200 mg by mouth daily.   Yes Historical Provider, MD  clonazePAM (KLONOPIN) 1 MG tablet Take 1 mg by mouth at bedtime as needed for anxiety.   Yes Historical Provider, MD  esomeprazole (NEXIUM) 40 MG capsule Take 40 mg by mouth daily before breakfast.   Yes Historical Provider, MD  furosemide (LASIX) 20 MG tablet Take 20-40 mg by mouth daily as needed for fluid.   Yes Historical Provider, MD  gabapentin (NEURONTIN) 800 MG tablet Take 800 mg by mouth 2 (two) times daily.   Yes Historical Provider, MD  rosuvastatin (CRESTOR) 20 MG tablet Take 20 mg by mouth daily.   Yes Historical Provider, MD  sitaGLIPtan-metformin (JANUMET) 50-1000 MG per tablet Take 1 tablet by mouth 2 (two) times daily.    Yes Historical Provider, MD  valsartan (DIOVAN) 80 MG tablet Take 40 mg by mouth daily.   Yes Historical Provider, MD  albuterol (PROVENTIL HFA;VENTOLIN HFA) 108 (90 BASE) MCG/ACT inhaler Inhale 2 puffs into the lungs every 6 (six) hours as needed for wheezing  or shortness of breath.    Historical Provider, MD  beclomethasone (QVAR) 80 MCG/ACT inhaler Inhale 1 puff into the lungs daily as needed (for shortness of breath). For shortness of breath    Historical Provider, MD  diphenhydrAMINE (BENADRYL) 25 MG tablet Take 25 mg by mouth every 6 (six) hours as needed. For allergies    Historical Provider, MD  fluconazole (DIFLUCAN) 150 MG tablet Take 150 mg by mouth daily as needed. For yeast    Historical Provider, MD  loratadine (CLARITIN) 10 MG tablet Take 10 mg by mouth daily as needed for allergies.    Historical Provider, MD  traMADol (ULTRAM) 50 MG tablet Take 1 tablet (50 mg total) by mouth every 6 (six) hours as needed. 05/10/15   Eber Hong, MD    BP 121/76 mmHg  Pulse 84  Temp(Src) 98.6 F (37 C) (Oral)  Resp 16  SpO2 97% Physical Exam  Constitutional: She appears well-developed and well-nourished. No distress.  HENT:  Head: Normocephalic and atraumatic.  Mouth/Throat: Oropharynx is clear and moist. No oropharyngeal exudate.  Eyes: Conjunctivae and EOM are normal. Pupils are equal, round, and reactive to light. Right eye exhibits no discharge. Left eye exhibits no discharge. No scleral icterus.  Neck: Normal range of motion. Neck supple. No JVD present. No thyromegaly present.  Cardiovascular: Normal rate, regular rhythm, normal heart sounds and intact distal pulses.  Exam reveals no gallop and no friction rub.   No murmur heard. Pulmonary/Chest: Effort normal and breath sounds normal. No respiratory distress. She has no wheezes. She has no rales.  Abdominal: Soft. Bowel sounds are normal. She exhibits no distension and no mass. There is no tenderness.  Musculoskeletal: Normal range of motion. She exhibits tenderness ( Focal tenderness to palpation over the anterior right shoulder, slight decreased range of motion secondary to pain, internal and external rotation with minimal pain, normal range of motion of elbow and wrist on the right, bilateral knees normal). She exhibits no edema.  Supple joints, soft compartments, some pain with straight leg raise but able to do this against resistance. Pain is mid thigh, no deformity  Lymphadenopathy:    She has no cervical adenopathy.  Neurological: She is alert. Coordination normal.  Normal strength and sensation of the bilateral upper and lower extremities  Skin: Skin is warm and dry. No rash noted. No erythema.  Psychiatric: She has a normal mood and affect. Her behavior is normal.  Nursing note and vitals reviewed.   ED Course  Procedures (including critical care time) Labs Review Labs Reviewed - No data to display  Imaging Review Dg Shoulder Right  05/10/2015   CLINICAL DATA:   Fall last evening with right shoulder pain, initial encounter  EXAM: RIGHT SHOULDER - 2+ VIEW  COMPARISON:  None.  FINDINGS: Mild degenerative changes of the acromioclavicular joint are seen. No acute fracture or dislocation is noted. No gross soft tissue abnormality is seen. The underlying bony thorax is unremarkable.  IMPRESSION: No acute abnormality noted.   Electronically Signed   By: Alcide Clever M.D.   On: 05/10/2015 12:27      MDM   Final diagnoses:  Contusion   Likely has contusion of the right shoulder, strain of the bilateral quadriceps muscles, given the location of the tenderness being mid thigh. X-ray of the right shoulder  Xray neg - pt has been ambulatory in the ED without difficulty  Meds given in ED:  Medications  traMADol (ULTRAM) tablet 50 mg (50 mg  Oral Given 05/10/15 1158)    Discharge Medication List as of 05/10/2015  1:05 PM    START taking these medications   Details  traMADol (ULTRAM) 50 MG tablet Take 1 tablet (50 mg total) by mouth every 6 (six) hours as needed., Starting 05/10/2015, Until Discontinued, Print            Eber Hong, MD 05/10/15 [phone]

## 2015-05-10 NOTE — ED Notes (Signed)
Patient c/o pain in right shoulder, lower back, and thighs bilaterally. Per patient fell last backwards onto back last night and landed on carpeted floor with right shoulder hitting a piece of furniture. Patient report "entire body is sore." Unsure if hit head but denies LOC. Per patient has taken her Celebrex, used rubbing alcohol on body and arthritis cream on back.

## 2015-05-10 NOTE — Discharge Instructions (Signed)
Please call your doctor for a followup appointment within 24-48 hours. When you talk to your doctor please let them know that you were seen in the emergency department and have them acquire all of your records so that they can discuss the findings with you and formulate a treatment plan to fully care for your new and ongoing problems. ° °

## 2015-08-11 ENCOUNTER — Ambulatory Visit: Payer: Medicaid Other | Admitting: Neurology

## 2015-08-18 ENCOUNTER — Telehealth: Payer: Self-pay | Admitting: *Deleted

## 2015-08-18 NOTE — Telephone Encounter (Signed)
Called and spoke w/ pt to reschedule appt for 10/4 to 10/13 at 830am for check in at 800am. Dr. Lucia Gaskins has a family emergency. She is going to let transportation know.

## 2015-08-19 ENCOUNTER — Ambulatory Visit: Payer: Medicaid Other | Admitting: Neurology

## 2015-08-28 ENCOUNTER — Encounter: Payer: Self-pay | Admitting: Neurology

## 2015-08-28 ENCOUNTER — Ambulatory Visit (INDEPENDENT_AMBULATORY_CARE_PROVIDER_SITE_OTHER): Payer: Medicaid Other | Admitting: Neurology

## 2015-08-28 VITALS — BP 121/74 | HR 91 | Ht 66.0 in | Wt 303.0 lb

## 2015-08-28 DIAGNOSIS — M47817 Spondylosis without myelopathy or radiculopathy, lumbosacral region: Secondary | ICD-10-CM | POA: Diagnosis not present

## 2015-08-28 DIAGNOSIS — M5417 Radiculopathy, lumbosacral region: Secondary | ICD-10-CM

## 2015-08-28 MED ORDER — GABAPENTIN 800 MG PO TABS: 800.0000 mg | ORAL_TABLET | Freq: Three times a day (TID) | ORAL | Status: DC

## 2015-08-28 NOTE — Patient Instructions (Addendum)
Overall you are doing fairly well but I do want to suggest a few things today:   Remember to drink plenty of fluid, eat healthy meals and do not skip any meals. Try to eat protein with a every meal and eat a healthy snack such as fruit or nuts in between meals. Try to keep a regular sleep-wake schedule and try to exercise daily, particularly in the form of walking, 20-30 minutes a day, if you can.   As far as your medications are concerned, I would like to suggest: Increase Gabapentin to 3x a day  As far as diagnostic testing: Injections into the low back  I would like to see you back in 3 months, sooner if we need to. Please call us with any interim questions, concerns, problems, updates or refill requests.   Our phone number is [phone]. We also have an after hours call service for urgent matters and there is a physician on-call for urgent questions. For any emergencies you know to call 911 or go to the nearest emergency room

## 2015-08-28 NOTE — Progress Notes (Signed)
[facility]    Provider:  Dr Lucia Gaskins Referring Provider: Samuel Jester, DO Primary Care Physician:  Samuel Jester, DO  CC:  Low back pain  HPI: Misty Hendrix is a 54 y.o. female here as a referral from Dr. Charm Barges for low back pain. She has a PMHx of morbid obesity, HTN, DM2, neuropathy, HLD. She reports chronic low back pain with pain that radiates into the left > right legs. Her left leg "gives out". She can't stand for a long period of time. She has lost 50 pounds but she is still 303 pounds. She is still trying to lose weight but she can't exercise due to the pain in the low back. She went to therapy for one day because insurance didn't cover it and she does the exercises she learned at home. She feels the arms are better in strength with less pain but the leg is giving her problems. She has chronic LBP and also hurts in the hips. Pain  Radiates from the back to the posterolateral thighs bilateral L>R. She also pain across the low back. She uses muscle rub and it helps and she put it on her knees too. Left leg is worse. She has burning and sharpness when the pain radiates, happens if she is sitting too long or if she is standing and walking. Her left leg gives out. She has to use a cane. She can walk better if she leans forward on the shopping cart or if she sits and bend over.   Reviewed notes, labs and imaging from outside physicians, which showed:  EMG/NCS:  Summary: Nerve conduction studies were performed on the bilateral lower extremities.  Bilateral Peroneal and Tibial motor conductions were within normal limits with normal F Wave latencies.  Bilateral Sural sensory conductions were within normal limits Bilateral H Reflex latencies were within normal limits.   MRI of the lumbar spine 12/14/2014:  T11-T12 through L2-L3 discs are normal. L3-L4 through L5-S1 show mild disc desiccation.  L3-L4: Bilateral facet arthrosis. Disc desiccation. No  stenosis.  L4-L5: Disc desiccation. Mild facet arthrosis. No stenosis.  L5-S1: Minimal disc desiccation. LEFT facet arthritis is present with vacuum joint or facet effusion. The RIGHT facet joint appears normal.  IMPRESSION: 1. Minimal lumbar degenerative disc disease. No stenosis. 2. LEFT L5-S1 facet arthritis. Facet arthrosis from L3-L4 through L5-S1.    EMG needle study was performed on selected left lower extremity muscles and bilateral paraspinal muscles. The left L5 paraspinal muscle showed increased spontaneous activity. The Iliopsoas, Vastus Medialis, Vastus Lateralis, Anterior Tibialis, Medial Gastrocnemius, Biceps Femoris (long head), Gluteus Minimus and Gluteus Maximus muscles were within normal limits. The left S1 and right L5/S1 paraspinal muscles were within normal limits.  Conclusion: EMG shows acute/ongoing denervation isolated to a left lower lumbar paraspinal muscle, suggestive but not definitive for  left-sided radiculopathy. Given the lack of similar changes in limb muscles and considerable overlap in paraspinal innervation, a more precise localization cannot be identified at this time. No evidence for large-fiber peripheral polyneuropathy. Technically difficult exam due to body habitus. Clinical correlation recommended.   Review of Systems: Patient complains of symptoms per HPI as well as the following symptoms: fatigue, eye itching and redness, SOB, nausea, joint pain, back pain, depression, anxiety. Pertinent negatives per HPI. All others negative.   Social History   Social History  . Marital Status: Single    Spouse Name: N/A  . Number of Children: 2  . Years of Education: 10   Occupational  History  . UNEMPLOYED    Social History Main Topics  . Smoking status: Former Smoker -- 0.50 packs/day for 28 years    Types: Cigarettes    Quit date: 11/16/2003  . Smokeless tobacco: Never Used  . Alcohol Use: No     Comment: None since 1991  . Drug Use: No   . Sexual Activity: Not on file   Other Topics Concern  . Not on file   Social History Narrative   Lives at home by herself, in apartment.    Caffeine use: Drinks coffee daily       Family History  Problem Relation Age of Onset  . Coronary artery disease Father   . Coronary artery disease Mother   . Cancer Brother     Throat  . COPD Paternal Aunt   . Asthma Maternal Uncle     Past Medical History  Diagnosis Date  . Type 2 diabetes mellitus (HCC)   . Essential hypertension, benign   . Mixed hyperlipidemia   . GERD (gastroesophageal reflux disease)   . Obstructive sleep apnea     CPAP  . Depression   . Asthma     Dr. Sherene SiresWert - h/o cough, better off ACE-I  . Obesity   . Diabetes mellitus     Past Surgical History  Procedure Laterality Date  . Cesarean section  1993    Current Outpatient Prescriptions  Medication Sig Dispense Refill  . albuterol (PROVENTIL HFA;VENTOLIN HFA) 108 (90 BASE) MCG/ACT inhaler Inhale 2 puffs into the lungs every 6 (six) hours as needed for wheezing or shortness of breath.    . beclomethasone (QVAR) 80 MCG/ACT inhaler Inhale 1 puff into the lungs daily as needed (for shortness of breath). For shortness of breath    . celecoxib (CELEBREX) 200 MG capsule Take 200 mg by mouth daily.    . clonazePAM (KLONOPIN) 1 MG tablet Take 1 mg by mouth at bedtime as needed for anxiety.    . diphenhydrAMINE (BENADRYL) 25 MG tablet Take 25 mg by mouth every 6 (six) hours as needed. For allergies    . esomeprazole (NEXIUM) 40 MG capsule Take 40 mg by mouth daily before breakfast.    . Exenatide (BYDUREON [place]) Inject 1 Dose into the skin once a week.    . fluconazole (DIFLUCAN) 150 MG tablet Take 150 mg by mouth daily as needed. For yeast    . furosemide (LASIX) 20 MG tablet Take 20-40 mg by mouth daily as needed for fluid.    Marland Kitchen. gabapentin (NEURONTIN) 800 MG tablet Take 800 mg by mouth 2 (two) times daily.    Marland Kitchen. loratadine (CLARITIN) 10 MG tablet Take 10 mg by mouth  daily as needed for allergies.    . rosuvastatin (CRESTOR) 20 MG tablet Take 20 mg by mouth daily.    . sitaGLIPtin (JANUVIA) 50 MG tablet Take 50 mg by mouth daily.    . traMADol (ULTRAM) 50 MG tablet Take 1 tablet (50 mg total) by mouth every 6 (six) hours as needed. 15 tablet 0  . valsartan (DIOVAN) 80 MG tablet Take 40 mg by mouth daily.     No current facility-administered medications for this visit.    Allergies as of 08/28/2015 - Review Complete 08/28/2015  Allergen Reaction Noted  . Dextromethorphan-guaifenesin Nausea And Vomiting 03/11/2011  . Benicar [olmesartan medoxomil] Rash 12/01/2011    Vitals: BP 121/74 mmHg  Pulse 91  Ht 5\' 6"  (1.676 m)  Wt 303 lb (137.44 kg)  BMI 48.93 kg/m2 Last Weight:  Wt Readings from Last 1 Encounters:  08/28/15 303 lb (137.44 kg)   Last Height:   Ht Readings from Last 1 Encounters:  08/28/15  (1.676 m)    Physical exam: Exam: Gen: NAD, conversant, well nourised, obese, well groomed                     CV: RRR, no MRG. No Carotid Bruits. No peripheral edema, warm, nontender Eyes: Conjunctivae clear without exudates or hemorrhage  Neuro: Detailed Neurologic Exam  Speech:    Speech is normal; fluent and spontaneous with normal comprehension.  Cognition:    The patient is oriented to person, place, and time;     recent and remote memory intact;     language fluent;     normal attention, concentration,     fund of knowledge Cranial Nerves:    The pupils are equal, round, and reactive to light. The fundi are normal and spontaneous venous pulsations are present. Visual fields are full to finger confrontation. Extraocular movements are intact. Trigeminal sensation is intact and the muscles of mastication are normal. The face is symmetric. The palate elevates in the midline. Hearing intact. Voice is normal. Shoulder shrug is normal. The tongue has normal motion without fasciculations.   Coordination:    Normal finger to nose and  heel to shin. Normal rapid alternating movements.   Gait:    Heel-toe and tandem gait are normal.   Motor Observation:    No asymmetry, no atrophy, and no involuntary movements noted. Tone:    Normal muscle tone.    Posture:    Posture is normal. normal erect    Strength: Left IP 4/5 with giveway Left Leg flexion 4+/5 Left DF 5-/5  Otherwise strength is V/V in the upper and lower limbs.      Sensation: intact to LT     Reflex Exam:  DTR's:    Deep tendon reflexes in the upper and lower extremities are brisk and symmetric bilaterally.   Toes:    The toes are downgoing bilaterally.   Clonus:    Clonus is absent.      Assessment/Plan:  54 year old female with low back pain and left>right sided radiculopathy.   Increase neurontin to 3x a day Refer to Dr Alferd Patee for facet bocks vs epidural steroid injections. Symptoms c/w radiculopathy but lumbar mri shows mostly facet arthrosis.  Needs weight loss.   Cc: Dr. Larae Grooms, MD  [facility] [address] [place], [place] [address]  Phone [phone] Fax [phone]

## 2015-09-09 ENCOUNTER — Telehealth: Payer: Self-pay | Admitting: Neurology

## 2015-09-09 NOTE — Telephone Encounter (Signed)
Thanks

## 2015-09-09 NOTE — Telephone Encounter (Signed)
Pt called sts she called Pain Management 09/08/15, left msg and hasn't heard back to schedule appt yet. Please call Pain Management and call pt with appt at [phone].

## 2015-09-09 NOTE — Telephone Encounter (Signed)
Pain Mgt. Called me back and Dr. Jordan LikesSpivey is not going to accept pain at this time. Patient is aware and I will try somewhere for her.

## 2015-09-09 NOTE — Telephone Encounter (Signed)
[facility] Patient wants to know if Hospital has a pain mgmt department that she could be referred to.

## 2015-09-09 NOTE — Telephone Encounter (Signed)
I am going to send patient the the [facility] they accept her insurance I will call patient with Details and a telephone number.

## 2015-09-10 ENCOUNTER — Other Ambulatory Visit: Payer: Self-pay | Admitting: *Deleted

## 2015-09-10 DIAGNOSIS — M5417 Radiculopathy, lumbosacral region: Secondary | ICD-10-CM

## 2015-09-10 DIAGNOSIS — M47817 Spondylosis without myelopathy or radiculopathy, lumbosacral region: Secondary | ICD-10-CM

## 2015-12-02 ENCOUNTER — Ambulatory Visit (INDEPENDENT_AMBULATORY_CARE_PROVIDER_SITE_OTHER): Payer: Self-pay | Admitting: Neurology

## 2015-12-02 DIAGNOSIS — M5440 Lumbago with sciatica, unspecified side: Secondary | ICD-10-CM

## 2015-12-02 NOTE — Progress Notes (Signed)
No show

## 2015-12-03 ENCOUNTER — Encounter: Payer: Self-pay | Admitting: Neurology

## 2015-12-22 ENCOUNTER — Other Ambulatory Visit: Payer: Self-pay | Admitting: Neurology

## 2015-12-22 ENCOUNTER — Ambulatory Visit (INDEPENDENT_AMBULATORY_CARE_PROVIDER_SITE_OTHER): Payer: Medicaid Other | Admitting: Neurology

## 2015-12-22 ENCOUNTER — Encounter: Payer: Self-pay | Admitting: Neurology

## 2015-12-22 VITALS — BP 129/81 | HR 84 | Ht 66.0 in | Wt 311.0 lb

## 2015-12-22 DIAGNOSIS — M545 Low back pain, unspecified: Secondary | ICD-10-CM

## 2015-12-22 DIAGNOSIS — R29898 Other symptoms and signs involving the musculoskeletal system: Secondary | ICD-10-CM | POA: Diagnosis not present

## 2015-12-22 DIAGNOSIS — M5416 Radiculopathy, lumbar region: Secondary | ICD-10-CM

## 2015-12-22 DIAGNOSIS — M5417 Radiculopathy, lumbosacral region: Secondary | ICD-10-CM

## 2015-12-22 DIAGNOSIS — W19XXXA Unspecified fall, initial encounter: Secondary | ICD-10-CM

## 2015-12-22 DIAGNOSIS — G8929 Other chronic pain: Secondary | ICD-10-CM

## 2015-12-22 NOTE — Progress Notes (Signed)
[facility]   Provider: Dr Lucia Gaskins Referring Provider: Samuel Jester, DO Primary Care Physician:Butler, Aram Beecham DO  CC: Low back pain  Interval history 12/22/2015: This is a lovely 55 year old here for back pain. Her back pain persists. We really need to get her epidural steroid injections and facet blocks. She was referred to a pain clinic who only wanted to give her pain medication which she doesn't want. Her exam and history is c/w lumbar radiculopathy and findings on emg support that. She is frustrated. Will refer her to Dr. Ethelene Hal for lumbar injections. Will also repeat MRI of the lumbar spine and consider Myelogram.  EMG showed  acute/ongoing denervation isolated to a left lower lumbar paraspinal muscle, suggestive but not definitive for left-sided radiculopathy. Given the lack of similar changes in limb muscles and considerable overlap in paraspinal innervation, a more precise localization cannot be identified at this time. Exam suggests L5/S1 Left > Right radiculopathy  Refer to Dr Ethelene Hal for facet bocks vs epidural steroid injections or both. Symptoms and emg/ncs c/w radiculopathy but lumbar mri shows mostly facet arthrosis. Will repeat MRi of the lumbar spine. Needs weight loss.   HPI: Misty Hendrix is a 55 y.o. female here as a referral from Dr. Charm Barges for low back pain. She has a PMHx of morbid obesity, HTN, DM2, neuropathy, HLD. She reports chronic low back pain with pain that radiates into the left > right legs. Her left leg "gives out". She can't stand for a long period of time. She has lost 50 pounds but she is still 303 pounds. She is still trying to lose weight but she can't exercise due to the pain in the low back. She went to therapy for one day because insurance didn't cover it and she does the exercises she learned at home. She feels the arms are better in strength with less pain but the leg is giving her problems. She has chronic LBP and also hurts in  the hips. Painradiates from the back to the posterolateral thighs bilateral L>R. She also pain across the low back. She uses muscle rub and it helps and she put it on her knees too. Left leg is worse. She has burning and sharpness when the pain radiates, happens if she is sitting too long or if she is standing and walking. Her left leg gives out. She has to use a cane. She can walk better if she leans forward on the shopping cart or if she sits and bend over.   Reviewed notes, labs and imaging from outside physicians, which showed:  EMG/NCS:  Summary: Nerve conduction studies were performed on the bilateral lower extremities.  Bilateral Peroneal and Tibial motor conductions were within normal limits with normal F Wave latencies.  Bilateral Sural sensory conductions were within normal limits Bilateral H Reflex latencies were within normal limits.   MRI of the lumbar spine 12/14/2014:  T11-T12 through L2-L3 discs are normal. L3-L4 through L5-S1 show mild disc desiccation.  L3-L4: Bilateral facet arthrosis. Disc desiccation. No stenosis.  L4-L5: Disc desiccation. Mild facet arthrosis. No stenosis.  L5-S1: Minimal disc desiccation. LEFT facet arthritis is present with vacuum joint or facet effusion. The RIGHT facet joint appears normal.  IMPRESSION: 1. Minimal lumbar degenerative disc disease. No stenosis. 2. LEFT L5-S1 facet arthritis. Facet arthrosis from L3-L4 through L5-S1.    EMG needle study was performed on selected left lower extremity muscles and bilateral paraspinal muscles. The left L5 paraspinal muscle showed increased spontaneous activity.  The Iliopsoas, Vastus Medialis, Vastus Lateralis, Anterior Tibialis, Medial Gastrocnemius, Biceps Femoris (long head), Gluteus Minimus and Gluteus Maximus muscles were within normal limits. The left S1 and right L5/S1 paraspinal muscles were within normal limits.  Conclusion: EMG shows acute/ongoing denervation isolated to  a left lower lumbar paraspinal muscle, suggestive but not definitive for left-sided radiculopathy. Given the lack of similar changes in limb muscles and considerable overlap in paraspinal innervation, a more precise localization cannot be identified at this time. No evidence for large-fiber peripheral polyneuropathy. Technically difficult exam due to body habitus. Clinical correlation recommended.   Review of Systems: Patient complains of symptoms per HPI as well as the following symptoms: fatigue, eye itching and redness, SOB, nausea, joint pain, back pain, depression, anxiety. Pertinent negatives per HPI. All others negative.    Social History   Social History  . Marital Status: Single    Spouse Name: N/A  . Number of Children: 2  . Years of Education: 10   Occupational History  . UNEMPLOYED    Social History Main Topics  . Smoking status: Former Smoker -- 0.50 packs/day for 28 years    Types: Cigarettes    Quit date: 11/16/2003  . Smokeless tobacco: Never Used  . Alcohol Use: No     Comment: None since 1991  . Drug Use: No  . Sexual Activity: Not on file   Other Topics Concern  . Not on file   Social History Narrative   Lives at home by herself, in apartment.    Caffeine use: Drinks coffee daily       Family History  Problem Relation Age of Onset  . Coronary artery disease Father   . Coronary artery disease Mother   . Cancer Brother     Throat  . COPD Paternal Aunt   . Asthma Maternal Uncle     Past Medical History  Diagnosis Date  . Type 2 diabetes mellitus (HCC)   . Essential hypertension, benign   . Mixed hyperlipidemia   . GERD (gastroesophageal reflux disease)   . Obstructive sleep apnea     CPAP  . Depression   . Asthma     Dr. Sherene Sires - h/o cough, better off ACE-I  . Obesity   . Diabetes mellitus     Past Surgical History  Procedure Laterality Date  . Cesarean section  1993    Current Outpatient Prescriptions  Medication Sig Dispense Refill    . albuterol (PROVENTIL HFA;VENTOLIN HFA) 108 (90 BASE) MCG/ACT inhaler Inhale 2 puffs into the lungs every 6 (six) hours as needed for wheezing or shortness of breath.    . beclomethasone (QVAR) 80 MCG/ACT inhaler Inhale 1 puff into the lungs daily as needed (for shortness of breath). For shortness of breath    . clonazePAM (KLONOPIN) 1 MG tablet Take 1 mg by mouth at bedtime as needed for anxiety.    Marland Kitchen esomeprazole (NEXIUM) 40 MG capsule Take 40 mg by mouth daily before breakfast.    . Exenatide (BYDUREON [place]) Inject 1 Dose into the skin once a week.    . fluconazole (DIFLUCAN) 150 MG tablet Take 150 mg by mouth daily as needed. For yeast    . furosemide (LASIX) 20 MG tablet Take 20-40 mg by mouth daily as needed for fluid.    Marland Kitchen gabapentin (NEURONTIN) 800 MG tablet Take 1 tablet (800 mg total) by mouth 3 (three) times daily. 90 tablet 11  . loratadine (CLARITIN) 10 MG tablet Take 10 mg by  mouth daily as needed for allergies.    . rosuvastatin (CRESTOR) 20 MG tablet Take 20 mg by mouth daily.    . sitaGLIPtin (JANUVIA) 50 MG tablet Take 50 mg by mouth daily.    . valsartan (DIOVAN) 80 MG tablet Take 40 mg by mouth daily.     No current facility-administered medications for this visit.    Allergies as of 12/22/2015 - Review Complete 12/22/2015  Allergen Reaction Noted  . Dextromethorphan-guaifenesin Nausea And Vomiting 03/11/2011  . Olmesartan Rash 12/22/2015  . Benicar [olmesartan medoxomil] Rash 12/01/2011    Vitals: BP 129/81 mmHg  Pulse 84  Ht 5\' 6"  (1.676 m)  Wt 311 lb (141.069 kg)  BMI 50.22 kg/m2 Last Weight:  Wt Readings from Last 1 Encounters:  12/22/15 311 lb (141.069 kg)   Last Height:   Ht Readings from Last 1 Encounters:  12/22/15 5\' 6"  (1.676 m)     Physical exam: Exam: Gen: NAD, conversant, well nourised, obese, well groomed  CV: RRR, no MRG. No Carotid Bruits. No peripheral edema, warm, nontender Eyes: Conjunctivae clear without exudates  or hemorrhage  Neuro: Detailed Neurologic Exam  Speech:  Speech is normal; fluent and spontaneous with normal comprehension.  Cognition:  The patient is oriented to person, place, and time;   recent and remote memory intact;   language fluent;   normal attention, concentration,   fund of knowledge Cranial Nerves:  The pupils are equal, round, and reactive to light. The fundi are normal and spontaneous venous pulsations are present. Visual fields are full to finger confrontation. Extraocular movements are intact. Trigeminal sensation is intact and the muscles of mastication are normal. The face is symmetric. The palate elevates in the midline. Hearing intact. Voice is normal. Shoulder shrug is normal. The tongue has normal motion without fasciculations.   Coordination:  Normal finger to nose and heel to shin. Normal rapid alternating movements.   Gait:  Heel-toe and tandem gait are normal.   Motor Observation:  No asymmetry, no atrophy, and no involuntary movements noted. Tone:  Normal muscle tone.   Posture:  Posture is normal. normal erect   Strength: Left IP 4/5 with giveway Left Leg flexion 4+/5 Left DF 5-/5 Otherwise strength is V/V in the upper and lower limbs.    Sensation: intact to LT   Reflex Exam:  DTR's:  Deep tendon reflexes in the upper and lower extremities are brisk and symmetric bilaterally.  Toes:  The toes are downgoing bilaterally.  Clonus:  Clonus is absent.     Assessment/Plan: 55 year old very lovely female with low back pain and left>right sided radiculopathy.  Her back pain persists. We really need to get her epidural steroid injections and facet blocks. She was referred to a pain clinic who only wanted to give her pain medication which she doesn't want. Her exam and history is c/w lumbar radiculopathy and findings on emg support that. She is frustrated.    EMG showed  acute/ongoing  denervation isolated to a left lower lumbar paraspinal muscle, suggestive but not definitive for left-sided radiculopathy. Given the lack of similar changes in limb muscles and considerable overlap in paraspinal innervation, a more precise localization cannot be identified at this time. Exam suggests L5/S1 Left > Right radiculopathy  Refer to Dr Ethelene Hal for facet bocks vs epidural steroid injections or both. Symptoms and emg/ncs c/w radiculopathy but lumbar mri shows mostly facet arthrosis. Will repeat MRi of the lumbar spine. Needs weight loss.   Cc: Dr.  Ramos and Dr. Charm Barges and Prudy Feeler    Assessment/Plan:    Misty Dean, MD  [facility] [address] [place], [place] [address]  Phone [phone] Fax [phone]  A total of 30 minutes was spent face-to-face with this patient. Over half this time was spent on counseling patient on the L5/S1 lumbar radiculopathy diagnosis and different diagnostic and therapeutic options available.

## 2015-12-22 NOTE — Patient Instructions (Signed)
Remember to drink plenty of fluid, eat healthy meals and do not skip any meals. Try to eat protein with a every meal and eat a healthy snack such as fruit or nuts in between meals. Try to keep a regular sleep-wake schedule and try to exercise daily, particularly in the form of walking, 20-30 minutes a day, if you can.   As far as diagnostic testing: MRI of the lumbar spine ans injections  I would like to see you back after injections, sooner if we need to. Please call us with any interim questions, concerns, problems, updates or refill requests.   Our phone number is [phone]. We also have an after hours call service for urgent matters and there is a physician on-call for urgent questions. For any emergencies you know to call 911 or go to the nearest emergency room

## 2015-12-23 ENCOUNTER — Telehealth: Payer: Self-pay | Admitting: *Deleted

## 2015-12-23 ENCOUNTER — Telehealth: Payer: Self-pay | Admitting: Neurology

## 2015-12-23 NOTE — Telephone Encounter (Signed)
LVM for [place]. Per Dr Lucia Gaskins: She wants to do one at a time. The first one will be an L5/S1 epidural steroid injection on the left. We can follow that up with facet block at another order. Gave patient name and DOB. Told her to call back if she has any further questions.

## 2015-12-23 NOTE — Telephone Encounter (Signed)
Spoke to [place]- she received voicemail.

## 2015-12-24 ENCOUNTER — Telehealth: Payer: Self-pay | Admitting: Neurology

## 2015-12-24 NOTE — Telephone Encounter (Signed)
Patient called me and and relayed she did not need to be set up for pain Mgt. Because she was already set up . I relayed to her that Dr. Lucia Gaskins wanted her to have. Patient stated she already had pain Mgt. Patient was very confused. I relayed to patient if she needed [place] to please give the office a call back.

## 2015-12-29 ENCOUNTER — Other Ambulatory Visit: Payer: Self-pay | Admitting: Neurology

## 2015-12-30 ENCOUNTER — Telehealth: Payer: Self-pay | Admitting: *Deleted

## 2015-12-30 ENCOUNTER — Other Ambulatory Visit: Payer: Medicaid Other

## 2015-12-30 DIAGNOSIS — M47816 Spondylosis without myelopathy or radiculopathy, lumbar region: Secondary | ICD-10-CM

## 2015-12-30 DIAGNOSIS — M47819 Spondylosis without myelopathy or radiculopathy, site unspecified: Secondary | ICD-10-CM

## 2015-12-30 NOTE — Telephone Encounter (Signed)
Misty Hendrix called from [facility] imaging. She stated pt going tomorrow for injections. She was denied MRI lumbar because she just had one a year ago. They can still proceed with injections though. Advised per Dr Lucia Gaskins LP order not supposed to be there.She verbalized understanding.

## 2015-12-30 NOTE — Telephone Encounter (Signed)
Thanks, after this would like to order facet injections. Would you mind finding out how soon after this injection we could order those?

## 2015-12-31 ENCOUNTER — Ambulatory Visit
Admission: RE | Admit: 2015-12-31 | Discharge: 2015-12-31 | Disposition: A | Discharge status: Home / Self Care | Payer: Medicaid Other | Source: Ambulatory Visit | Attending: Neurology | Admitting: Neurology

## 2015-12-31 ENCOUNTER — Other Ambulatory Visit: Payer: Self-pay | Admitting: Neurology

## 2015-12-31 DIAGNOSIS — G8929 Other chronic pain: Secondary | ICD-10-CM

## 2015-12-31 DIAGNOSIS — M545 Low back pain, unspecified: Secondary | ICD-10-CM

## 2015-12-31 MED ORDER — IOHEXOL 180 MG/ML  SOLN
1.0000 mL | Freq: Once | INTRAMUSCULAR | Status: AC | PRN
  Administered 2015-12-31: 1 mL via EPIDURAL

## 2015-12-31 MED ORDER — METHYLPREDNISOLONE ACETATE 40 MG/ML INJ SUSP (RADIOLOG
120.0000 mg | Freq: Once | INTRAMUSCULAR | Status: AC
  Administered 2015-12-31: 120 mg via EPIDURAL

## 2015-12-31 NOTE — Discharge Instructions (Signed)

## 2016-01-01 NOTE — Telephone Encounter (Signed)
Bilateral L5/S1 facet blocks for facet arthrosis.

## 2016-01-01 NOTE — Addendum Note (Signed)
Addended by: Hillis Range on: 01/01/2016 05:58 PM   Modules accepted: Orders

## 2016-01-01 NOTE — Telephone Encounter (Signed)
Placed orders per Dr Lucia Gaskins request.

## 2016-01-01 NOTE — Telephone Encounter (Signed)
Called [place] at [facility] imaging. She advised pt normally waits 2 weeks between steroid injection and facet injection. She stated I can place order and put this in instructions. She will call to get pt scheduled.

## 2016-01-16 ENCOUNTER — Ambulatory Visit
Admission: RE | Admit: 2016-01-16 | Discharge: 2016-01-16 | Disposition: A | Discharge status: Home / Self Care | Payer: Medicaid Other | Source: Ambulatory Visit | Attending: Neurology | Admitting: Neurology

## 2016-01-16 DIAGNOSIS — M47819 Spondylosis without myelopathy or radiculopathy, site unspecified: Secondary | ICD-10-CM

## 2016-01-16 MED ORDER — METHYLPREDNISOLONE ACETATE 40 MG/ML INJ SUSP (RADIOLOG
120.0000 mg | Freq: Once | INTRAMUSCULAR | Status: AC
  Administered 2016-01-16: 120 mg via INTRA_ARTICULAR

## 2016-01-16 NOTE — Discharge Instructions (Signed)

## 2016-04-01 ENCOUNTER — Ambulatory Visit: Payer: Self-pay | Admitting: General Surgery

## 2016-04-26 ENCOUNTER — Encounter (HOSPITAL_BASED_OUTPATIENT_CLINIC_OR_DEPARTMENT_OTHER): Payer: Self-pay | Admitting: *Deleted

## 2016-04-26 DIAGNOSIS — E119 Type 2 diabetes mellitus without complications: Secondary | ICD-10-CM | POA: Diagnosis not present

## 2016-04-26 DIAGNOSIS — G4730 Sleep apnea, unspecified: Secondary | ICD-10-CM | POA: Diagnosis not present

## 2016-04-26 DIAGNOSIS — M1990 Unspecified osteoarthritis, unspecified site: Secondary | ICD-10-CM | POA: Diagnosis not present

## 2016-04-26 DIAGNOSIS — K8020 Calculus of gallbladder without cholecystitis without obstruction: Secondary | ICD-10-CM | POA: Diagnosis present

## 2016-04-26 DIAGNOSIS — Z87891 Personal history of nicotine dependence: Secondary | ICD-10-CM | POA: Diagnosis not present

## 2016-04-26 DIAGNOSIS — I10 Essential (primary) hypertension: Secondary | ICD-10-CM | POA: Diagnosis not present

## 2016-04-26 DIAGNOSIS — F419 Anxiety disorder, unspecified: Secondary | ICD-10-CM | POA: Diagnosis not present

## 2016-04-26 DIAGNOSIS — Z791 Long term (current) use of non-steroidal anti-inflammatories (NSAID): Secondary | ICD-10-CM | POA: Diagnosis not present

## 2016-04-26 DIAGNOSIS — Z6841 Body Mass Index (BMI) 40.0 and over, adult: Secondary | ICD-10-CM | POA: Diagnosis not present

## 2016-04-26 DIAGNOSIS — Z79899 Other long term (current) drug therapy: Secondary | ICD-10-CM | POA: Diagnosis not present

## 2016-04-26 DIAGNOSIS — K8010 Calculus of gallbladder with chronic cholecystitis without obstruction: Secondary | ICD-10-CM | POA: Diagnosis not present

## 2016-04-26 DIAGNOSIS — Z7984 Long term (current) use of oral hypoglycemic drugs: Secondary | ICD-10-CM | POA: Diagnosis not present

## 2016-04-26 DIAGNOSIS — K219 Gastro-esophageal reflux disease without esophagitis: Secondary | ICD-10-CM | POA: Diagnosis not present

## 2016-04-26 DIAGNOSIS — E7800 Pure hypercholesterolemia, unspecified: Secondary | ICD-10-CM | POA: Diagnosis not present

## 2016-04-26 LAB — COMPREHENSIVE METABOLIC PANEL
ALK PHOS: 92 U/L (ref 38–126)
ALT: 51 U/L (ref 14–54)
ANION GAP: 8 (ref 5–15)
AST: 53 U/L — ABNORMAL HIGH (ref 15–41)
Albumin: 4.8 g/dL (ref 3.5–5.0)
BUN: 15 mg/dL (ref 6–20)
CALCIUM: 9.8 mg/dL (ref 8.9–10.3)
CO2: 31 mmol/L (ref 22–32)
Chloride: 105 mmol/L (ref 101–111)
Creatinine, Ser: 0.89 mg/dL (ref 0.44–1.00)
GFR calc non Af Amer: >60 mL/min (ref >60)
Glucose, Bld: 117 mg/dL — ABNORMAL HIGH (ref 65–99)
POTASSIUM: 4.4 mmol/L (ref 3.5–5.1)
Sodium: 144 mmol/L (ref 135–145)
TOTAL PROTEIN: 8.1 g/dL (ref 6.5–8.1)
Total Bilirubin: 0.4 mg/dL (ref 0.3–1.2)

## 2016-04-26 LAB — CBC WITH DIFFERENTIAL/PLATELET
Basophils Absolute: 0 10*3/uL (ref 0.0–0.1)
Basophils Relative: 0 %
Eosinophils Absolute: 0.2 10*3/uL (ref 0.0–0.7)
Eosinophils Relative: 2 %
HCT: 37.6 % (ref 36.0–46.0)
HEMOGLOBIN: 12.4 g/dL (ref 12.0–15.0)
LYMPHS ABS: 3.8 10*3/uL (ref 0.7–4.0)
LYMPHS PCT: 38 %
MCH: 30 pg (ref 26.0–34.0)
MCHC: 33 g/dL (ref 30.0–36.0)
MCV: 91 fL (ref 78.0–100.0)
Monocytes Absolute: 0.4 10*3/uL (ref 0.1–1.0)
Monocytes Relative: 4 %
NEUTROS PCT: 56 %
Neutro Abs: 5.7 10*3/uL (ref 1.7–7.7)
Platelets: 318 10*3/uL (ref 150–400)
RBC: 4.13 MIL/uL (ref 3.87–5.11)
RDW: 14.4 % (ref 11.5–15.5)
WBC: 10 10*3/uL (ref 4.0–10.5)

## 2016-04-26 NOTE — Progress Notes (Signed)
[facility] 09[facility]-Due to BMI >45 plan to meet with anesthesia,recheck weight prior to surgery. Will draw pre-op labs during that visit. Instructed Npo after Mn-hold morning januvia,will bring albuterol  Inhaler.

## 2016-04-26 NOTE — Progress Notes (Signed)
After pre -op anesthesia evaluation,Dr Denenny stated will be fine to proceed at [facility] ,but recomends overnight observation.Will give message to Dr Raford PitcherKinsingers's scheduler Camelia Engerri.

## 2016-04-26 NOTE — Anesthesia Preprocedure Evaluation (Addendum)
Anesthesia Evaluation  Patient identified by MRN, date of birth, ID band Patient awake    Reviewed: Allergy & Precautions, NPO status , Patient's Chart, lab work & pertinent test results  Airway Mallampati: II  TM Distance: >3 FB Neck ROM: Full    Dental  (+) Partial Upper Missing some upper teeth (partial):   Pulmonary shortness of breath, asthma , sleep apnea , former smoker,    Pulmonary exam normal breath sounds clear to auscultation       Cardiovascular Exercise Tolerance: Good hypertension, Pt. on medications Normal cardiovascular exam Rhythm:Regular Rate:Normal     Neuro/Psych PSYCHIATRIC DISORDERS Anxiety Depression negative neurological ROS     GI/Hepatic Neg liver ROS, GERD  Medicated,  Endo/Other  diabetes, Type 2, Oral Hypoglycemic AgentsMorbid obesity  Renal/GU negative Renal ROS  negative genitourinary   Musculoskeletal negative musculoskeletal ROS (+)   Abdominal (+) + obese,   Peds negative pediatric ROS (+)  Hematology negative hematology ROS (+)   Anesthesia Other Findings   Reproductive/Obstetrics negative OB ROS                          Anesthesia Physical Anesthesia Plan  ASA: III  Anesthesia Plan: General   Post-op Pain Management:    Induction: Intravenous  Airway Management Planned: Oral ETT  Additional Equipment:   Intra-op Plan:   Post-operative Plan: Extubation in OR  Informed Consent: I have reviewed the patients History and Physical, chart, labs and discussed the procedure including the risks, benefits and alternatives for the proposed anesthesia with the patient or authorized representative who has indicated his/her understanding and acceptance.   Dental advisory given  Plan Discussed with: CRNA  Anesthesia Plan Comments: (Probable overnight stay.)       Anesthesia Quick Evaluation

## 2016-04-29 ENCOUNTER — Ambulatory Visit (HOSPITAL_BASED_OUTPATIENT_CLINIC_OR_DEPARTMENT_OTHER): Payer: Medicaid Other | Admitting: Anesthesiology

## 2016-04-29 ENCOUNTER — Observation Stay (HOSPITAL_BASED_OUTPATIENT_CLINIC_OR_DEPARTMENT_OTHER)
Admission: RE | Admit: 2016-04-29 | Discharge: 2016-04-30 | Disposition: A | Discharge status: Home / Self Care | Payer: Medicaid Other | Source: Ambulatory Visit | Attending: General Surgery | Admitting: General Surgery

## 2016-04-29 ENCOUNTER — Encounter (HOSPITAL_COMMUNITY)
Admission: RE | Disposition: A | Discharge status: Home / Self Care | Payer: Self-pay | Source: Ambulatory Visit | Attending: General Surgery

## 2016-04-29 ENCOUNTER — Encounter (HOSPITAL_BASED_OUTPATIENT_CLINIC_OR_DEPARTMENT_OTHER): Payer: Self-pay | Admitting: Anesthesiology

## 2016-04-29 DIAGNOSIS — Z79899 Other long term (current) drug therapy: Secondary | ICD-10-CM | POA: Insufficient documentation

## 2016-04-29 DIAGNOSIS — E119 Type 2 diabetes mellitus without complications: Secondary | ICD-10-CM | POA: Diagnosis not present

## 2016-04-29 DIAGNOSIS — K8010 Calculus of gallbladder with chronic cholecystitis without obstruction: Principal | ICD-10-CM | POA: Insufficient documentation

## 2016-04-29 DIAGNOSIS — G4730 Sleep apnea, unspecified: Secondary | ICD-10-CM | POA: Diagnosis not present

## 2016-04-29 DIAGNOSIS — Z87891 Personal history of nicotine dependence: Secondary | ICD-10-CM | POA: Insufficient documentation

## 2016-04-29 DIAGNOSIS — K219 Gastro-esophageal reflux disease without esophagitis: Secondary | ICD-10-CM | POA: Insufficient documentation

## 2016-04-29 DIAGNOSIS — Z791 Long term (current) use of non-steroidal anti-inflammatories (NSAID): Secondary | ICD-10-CM | POA: Insufficient documentation

## 2016-04-29 DIAGNOSIS — E7800 Pure hypercholesterolemia, unspecified: Secondary | ICD-10-CM | POA: Insufficient documentation

## 2016-04-29 DIAGNOSIS — Z6841 Body Mass Index (BMI) 40.0 and over, adult: Secondary | ICD-10-CM | POA: Insufficient documentation

## 2016-04-29 DIAGNOSIS — I10 Essential (primary) hypertension: Secondary | ICD-10-CM | POA: Insufficient documentation

## 2016-04-29 DIAGNOSIS — F419 Anxiety disorder, unspecified: Secondary | ICD-10-CM | POA: Insufficient documentation

## 2016-04-29 DIAGNOSIS — Z7984 Long term (current) use of oral hypoglycemic drugs: Secondary | ICD-10-CM | POA: Insufficient documentation

## 2016-04-29 DIAGNOSIS — M1990 Unspecified osteoarthritis, unspecified site: Secondary | ICD-10-CM | POA: Insufficient documentation

## 2016-04-29 HISTORY — PX: CHOLECYSTECTOMY: SHX55

## 2016-04-29 HISTORY — DX: Anxiety disorder, unspecified: F41.9

## 2016-04-29 HISTORY — DX: Dorsalgia, unspecified: M54.9

## 2016-04-29 HISTORY — DX: Other chronic pain: G89.29

## 2016-04-29 LAB — CBC
HCT: 35.9 % — ABNORMAL LOW (ref 36.0–46.0)
HEMOGLOBIN: 11.5 g/dL — AB (ref 12.0–15.0)
MCH: 29.9 pg (ref 26.0–34.0)
MCHC: 32 g/dL (ref 30.0–36.0)
MCV: 93.2 fL (ref 78.0–100.0)
PLATELETS: 273 10*3/uL (ref 150–400)
RBC: 3.85 MIL/uL — AB (ref 3.87–5.11)
RDW: 14.7 % (ref 11.5–15.5)
WBC: 12.2 10*3/uL — AB (ref 4.0–10.5)

## 2016-04-29 LAB — GLUCOSE, CAPILLARY
GLUCOSE-CAPILLARY: 165 mg/dL — AB (ref 65–99)
GLUCOSE-CAPILLARY: 210 mg/dL — AB (ref 65–99)
Glucose-Capillary: 119 mg/dL — ABNORMAL HIGH (ref 65–99)
Glucose-Capillary: 132 mg/dL — ABNORMAL HIGH (ref 65–99)

## 2016-04-29 LAB — CREATININE, SERUM
CREATININE: 1.16 mg/dL — AB (ref 0.44–1.00)
GFR, EST AFRICAN AMERICAN: 60 mL/min — AB (ref >60)
GFR, EST NON AFRICAN AMERICAN: 52 mL/min — AB (ref >60)

## 2016-04-29 SURGERY — LAPAROSCOPIC CHOLECYSTECTOMY
Anesthesia: General

## 2016-04-29 MED ORDER — ENOXAPARIN SODIUM 40 MG/0.4ML ~~LOC~~ SOLN
40.0000 mg | SUBCUTANEOUS | Status: DC
  Administered 2016-04-30: 40 mg via SUBCUTANEOUS
  Filled 2016-04-29 (×2): qty 0.4

## 2016-04-29 MED ORDER — HEPARIN SODIUM (PORCINE) 5000 UNIT/ML IJ SOLN
INTRAMUSCULAR | Status: AC
  Filled 2016-04-29: qty 1

## 2016-04-29 MED ORDER — LIDOCAINE HCL (CARDIAC) 20 MG/ML IV SOLN
INTRAVENOUS | Status: DC | PRN
  Administered 2016-04-29: 100 mg via INTRAVENOUS

## 2016-04-29 MED ORDER — HEPARIN SODIUM (PORCINE) 5000 UNIT/ML IJ SOLN
5000.0000 [IU] | Freq: Once | INTRAMUSCULAR | Status: AC
  Administered 2016-04-29: 5000 [IU] via SUBCUTANEOUS
  Filled 2016-04-29: qty 1

## 2016-04-29 MED ORDER — FENTANYL CITRATE (PF) 100 MCG/2ML IJ SOLN
INTRAMUSCULAR | Status: AC
Start: 2016-04-29 — End: 2016-04-29
  Filled 2016-04-29: qty 2

## 2016-04-29 MED ORDER — EPHEDRINE SULFATE 50 MG/ML IJ SOLN
INTRAMUSCULAR | Status: DC | PRN
  Administered 2016-04-29 (×2): 10 mg via INTRAVENOUS

## 2016-04-29 MED ORDER — PHENYLEPHRINE HCL 10 MG/ML IJ SOLN
INTRAMUSCULAR | Status: AC
  Filled 2016-04-29: qty 1

## 2016-04-29 MED ORDER — ONDANSETRON HCL 4 MG/2ML IJ SOLN
INTRAMUSCULAR | Status: AC
  Filled 2016-04-29: qty 2

## 2016-04-29 MED ORDER — MIDAZOLAM HCL 5 MG/5ML IJ SOLN
INTRAMUSCULAR | Status: DC | PRN
  Administered 2016-04-29: 2 mg via INTRAVENOUS

## 2016-04-29 MED ORDER — SUCCINYLCHOLINE CHLORIDE 20 MG/ML IJ SOLN
INTRAMUSCULAR | Status: DC | PRN
  Administered 2016-04-29: 100 mg via INTRAVENOUS

## 2016-04-29 MED ORDER — GLYCOPYRROLATE 0.2 MG/ML IJ SOLN
INTRAMUSCULAR | Status: AC
  Filled 2016-04-29: qty 1

## 2016-04-29 MED ORDER — PHENYLEPHRINE HCL 10 MG/ML IJ SOLN
INTRAMUSCULAR | Status: DC | PRN
  Administered 2016-04-29: 40 ug via INTRAVENOUS

## 2016-04-29 MED ORDER — INSULIN ASPART 100 UNIT/ML ~~LOC~~ SOLN
0.0000 [IU] | Freq: Three times a day (TID) | SUBCUTANEOUS | Status: DC
  Administered 2016-04-29: 3 [IU] via SUBCUTANEOUS
  Administered 2016-04-30: 2 [IU] via SUBCUTANEOUS

## 2016-04-29 MED ORDER — FENTANYL CITRATE (PF) 100 MCG/2ML IJ SOLN
INTRAMUSCULAR | Status: AC
  Filled 2016-04-29: qty 2

## 2016-04-29 MED ORDER — PROPOFOL 10 MG/ML IV BOLUS
INTRAVENOUS | Status: AC
  Filled 2016-04-29: qty 40

## 2016-04-29 MED ORDER — IRBESARTAN 75 MG PO TABS
37.5000 mg | ORAL_TABLET | Freq: Every day | ORAL | Status: DC
  Administered 2016-04-29 – 2016-04-30 (×2): 37.5 mg via ORAL
  Filled 2016-04-29 (×2): qty 0.5

## 2016-04-29 MED ORDER — ALBUTEROL SULFATE (2.5 MG/3ML) 0.083% IN NEBU: 3.0000 mL | INHALATION_SOLUTION | Freq: Four times a day (QID) | RESPIRATORY_TRACT | Status: DC | PRN

## 2016-04-29 MED ORDER — ONDANSETRON HCL 4 MG/2ML IJ SOLN
INTRAMUSCULAR | Status: AC
Start: 2016-04-29 — End: 2016-04-29
  Filled 2016-04-29: qty 2

## 2016-04-29 MED ORDER — PROPOFOL 10 MG/ML IV BOLUS
INTRAVENOUS | Status: DC | PRN
  Administered 2016-04-29: 250 mg via INTRAVENOUS
  Administered 2016-04-29: 50 mg via INTRAVENOUS

## 2016-04-29 MED ORDER — BECLOMETHASONE DIPROPIONATE 80 MCG/ACT IN AERS
1.0000 | INHALATION_SPRAY | Freq: Every day | RESPIRATORY_TRACT | Status: DC | PRN
Start: 2016-04-29 — End: 2016-04-29

## 2016-04-29 MED ORDER — ONDANSETRON 4 MG PO TBDP: 4.0000 mg | ORAL_TABLET | Freq: Four times a day (QID) | ORAL | Status: DC | PRN

## 2016-04-29 MED ORDER — SODIUM CHLORIDE 0.9 % IV SOLN
INTRAVENOUS | Status: DC
  Administered 2016-04-29 (×2): via INTRAVENOUS

## 2016-04-29 MED ORDER — MORPHINE SULFATE (PF) 4 MG/ML IV SOLN: 4.0000 mg | INTRAVENOUS | Status: DC | PRN

## 2016-04-29 MED ORDER — LACTATED RINGERS IV SOLN
INTRAVENOUS | Status: DC
  Administered 2016-04-29 (×2): via INTRAVENOUS
  Filled 2016-04-29: qty 1000

## 2016-04-29 MED ORDER — KETOROLAC TROMETHAMINE 15 MG/ML IJ SOLN
15.0000 mg | Freq: Four times a day (QID) | INTRAMUSCULAR | Status: DC
  Administered 2016-04-29 – 2016-04-30 (×3): 15 mg via INTRAVENOUS
  Filled 2016-04-29 (×7): qty 1

## 2016-04-29 MED ORDER — LINAGLIPTIN 5 MG PO TABS
5.0000 mg | ORAL_TABLET | Freq: Every day | ORAL | Status: DC
  Administered 2016-04-29 – 2016-04-30 (×2): 5 mg via ORAL
  Filled 2016-04-29 (×2): qty 1

## 2016-04-29 MED ORDER — ACETAMINOPHEN 325 MG PO TABS
650.0000 mg | ORAL_TABLET | Freq: Four times a day (QID) | ORAL | Status: DC | PRN
  Filled 2016-04-29: qty 2

## 2016-04-29 MED ORDER — OXYCODONE-ACETAMINOPHEN 5-325 MG PO TABS: 1.0000 | ORAL_TABLET | ORAL | Status: DC | PRN

## 2016-04-29 MED ORDER — SIMETHICONE 80 MG PO CHEW: 40.0000 mg | CHEWABLE_TABLET | Freq: Four times a day (QID) | ORAL | Status: DC | PRN

## 2016-04-29 MED ORDER — ACETAMINOPHEN 650 MG RE SUPP: 650.0000 mg | Freq: Four times a day (QID) | RECTAL | Status: DC | PRN

## 2016-04-29 MED ORDER — KETOROLAC TROMETHAMINE 30 MG/ML IJ SOLN
INTRAMUSCULAR | Status: DC | PRN
  Administered 2016-04-29: 30 mg via INTRAVENOUS

## 2016-04-29 MED ORDER — ONDANSETRON HCL 4 MG/2ML IJ SOLN
INTRAMUSCULAR | Status: DC | PRN
  Administered 2016-04-29: 8 mg via INTRAVENOUS

## 2016-04-29 MED ORDER — PANTOPRAZOLE SODIUM 40 MG PO TBEC
80.0000 mg | DELAYED_RELEASE_TABLET | Freq: Every day | ORAL | Status: DC
  Administered 2016-04-29 – 2016-04-30 (×2): 80 mg via ORAL
  Filled 2016-04-29: qty 2

## 2016-04-29 MED ORDER — METOCLOPRAMIDE HCL 5 MG/ML IJ SOLN
INTRAMUSCULAR | Status: DC | PRN
  Administered 2016-04-29: 10 mg via INTRAVENOUS

## 2016-04-29 MED ORDER — LORATADINE 10 MG PO TABS
10.0000 mg | ORAL_TABLET | Freq: Every day | ORAL | Status: DC | PRN
  Filled 2016-04-29: qty 1

## 2016-04-29 MED ORDER — FENTANYL CITRATE (PF) 100 MCG/2ML IJ SOLN
25.0000 ug | INTRAMUSCULAR | Status: DC | PRN
  Administered 2016-04-29: 50 ug via INTRAVENOUS
  Filled 2016-04-29: qty 1

## 2016-04-29 MED ORDER — DEXTROSE 5 % IV SOLN
2.0000 g | INTRAVENOUS | Status: AC
  Administered 2016-04-29: 2 g via INTRAVENOUS
  Filled 2016-04-29: qty 2

## 2016-04-29 MED ORDER — PROMETHAZINE HCL 25 MG/ML IJ SOLN
6.2500 mg | INTRAMUSCULAR | Status: DC | PRN
  Filled 2016-04-29: qty 1

## 2016-04-29 MED ORDER — MIDAZOLAM HCL 2 MG/2ML IJ SOLN
INTRAMUSCULAR | Status: AC
  Filled 2016-04-29: qty 2

## 2016-04-29 MED ORDER — GLYCOPYRROLATE 0.2 MG/ML IJ SOLN
INTRAMUSCULAR | Status: DC | PRN
  Administered 2016-04-29: 0.6 mg via INTRAVENOUS

## 2016-04-29 MED ORDER — EPHEDRINE SULFATE 50 MG/ML IJ SOLN
INTRAMUSCULAR | Status: AC
  Filled 2016-04-29: qty 1

## 2016-04-29 MED ORDER — ROCURONIUM BROMIDE 100 MG/10ML IV SOLN
INTRAVENOUS | Status: AC
  Filled 2016-04-29: qty 1

## 2016-04-29 MED ORDER — DEXAMETHASONE SODIUM PHOSPHATE 10 MG/ML IJ SOLN
INTRAMUSCULAR | Status: AC
  Filled 2016-04-29: qty 2

## 2016-04-29 MED ORDER — ONDANSETRON HCL 4 MG/2ML IJ SOLN: 4.0000 mg | Freq: Four times a day (QID) | INTRAMUSCULAR | Status: DC | PRN

## 2016-04-29 MED ORDER — FENTANYL CITRATE (PF) 100 MCG/2ML IJ SOLN
INTRAMUSCULAR | Status: DC | PRN
  Administered 2016-04-29 (×4): 25 ug via INTRAVENOUS
  Administered 2016-04-29: 50 ug via INTRAVENOUS
  Administered 2016-04-29 (×2): 25 ug via INTRAVENOUS

## 2016-04-29 MED ORDER — DIPHENHYDRAMINE HCL 50 MG/ML IJ SOLN: 25.0000 mg | Freq: Four times a day (QID) | INTRAMUSCULAR | Status: DC | PRN

## 2016-04-29 MED ORDER — CEFOTETAN DISODIUM-DEXTROSE 2-2.08 GM-% IV SOLR
INTRAVENOUS | Status: AC
  Filled 2016-04-29: qty 50

## 2016-04-29 MED ORDER — DIPHENHYDRAMINE HCL 25 MG PO CAPS: 25.0000 mg | ORAL_CAPSULE | Freq: Four times a day (QID) | ORAL | Status: DC | PRN

## 2016-04-29 MED ORDER — LIDOCAINE HCL (CARDIAC) 20 MG/ML IV SOLN
INTRAVENOUS | Status: AC
  Filled 2016-04-29: qty 5

## 2016-04-29 MED ORDER — GABAPENTIN 400 MG PO CAPS
800.0000 mg | ORAL_CAPSULE | Freq: Three times a day (TID) | ORAL | Status: DC
  Administered 2016-04-29 – 2016-04-30 (×3): 800 mg via ORAL
  Filled 2016-04-29 (×5): qty 2

## 2016-04-29 MED ORDER — NEOSTIGMINE METHYLSULFATE 10 MG/10ML IV SOLN
INTRAVENOUS | Status: DC | PRN
  Administered 2016-04-29: 5 mg via INTRAVENOUS

## 2016-04-29 MED ORDER — NEOSTIGMINE METHYLSULFATE 10 MG/10ML IV SOLN
INTRAVENOUS | Status: AC
  Filled 2016-04-29: qty 1

## 2016-04-29 MED ORDER — DEXAMETHASONE SODIUM PHOSPHATE 4 MG/ML IJ SOLN
INTRAMUSCULAR | Status: DC | PRN
  Administered 2016-04-29: 10 mg via INTRAVENOUS

## 2016-04-29 MED ORDER — BUPIVACAINE-EPINEPHRINE 0.25% -1:200000 IJ SOLN
INTRAMUSCULAR | Status: DC | PRN
  Administered 2016-04-29: 30 mL

## 2016-04-29 MED ORDER — BUDESONIDE 0.25 MG/2ML IN SUSP: 0.2500 mg | Freq: Two times a day (BID) | RESPIRATORY_TRACT | Status: DC | PRN

## 2016-04-29 MED ORDER — ROCURONIUM BROMIDE 100 MG/10ML IV SOLN
INTRAVENOUS | Status: DC | PRN
  Administered 2016-04-29 (×2): 10 mg via INTRAVENOUS
  Administered 2016-04-29: 20 mg via INTRAVENOUS

## 2016-04-29 MED ORDER — ALPRAZOLAM 0.5 MG PO TABS: 0.5000 mg | ORAL_TABLET | Freq: Every evening | ORAL | Status: DC | PRN

## 2016-04-29 SURGICAL SUPPLY — 54 items
APPLIER CLIP ROT 10 11.4 M/L (STAPLE)
BLADE SURG 11 STRL SS (BLADE) ×3 IMPLANT
CATH CHOLANG 76X19 KUMAR (CATHETERS) IMPLANT
CHLORAPREP W/TINT 26ML (MISCELLANEOUS) ×3 IMPLANT
CLIP APPLIE ROT 10 11.4 M/L (STAPLE) IMPLANT
CLIP LIGATING HEM O LOK PURPLE (MISCELLANEOUS) ×3 IMPLANT
CLIP LIGATING HEMO LOK XL GOLD (MISCELLANEOUS) IMPLANT
COVER BACK TABLE 60X90IN (DRAPES) ×3 IMPLANT
COVER MAYO STAND STRL (DRAPES) ×3 IMPLANT
CUTTER FLEX LINEAR 45M (STAPLE) IMPLANT
DECANTER SPIKE VIAL GLASS SM (MISCELLANEOUS) ×3 IMPLANT
DEVICE PMI PUNCTURE CLOSURE (MISCELLANEOUS) IMPLANT
DRAIN CHANNEL 19F RND (DRAIN) IMPLANT
DRAPE C-ARM 42X72 X-RAY (DRAPES) IMPLANT
DRAPE LAPAROSCOPIC ABDOMINAL (DRAPES) ×3 IMPLANT
DRAPE UTILITY XL STRL (DRAPES) ×3 IMPLANT
ELECT REM PT RETURN 9FT ADLT (ELECTROSURGICAL) ×3
ELECTRODE REM PT RTRN 9FT ADLT (ELECTROSURGICAL) ×1 IMPLANT
EVACUATOR SILICONE 100CC (DRAIN) IMPLANT
GLOVE BIOGEL PI IND STRL 6 (GLOVE) ×3 IMPLANT
GLOVE BIOGEL PI IND STRL 7.0 (GLOVE) ×1 IMPLANT
GLOVE BIOGEL PI INDICATOR 6 (GLOVE) ×6
GLOVE BIOGEL PI INDICATOR 7.0 (GLOVE) ×2
GLOVE SS BIOGEL STRL SZ 7 (GLOVE) ×3 IMPLANT
GLOVE SUPERSENSE BIOGEL SZ 7 (GLOVE) ×6
GLOVE SURG SS PI 7.0 STRL IVOR (GLOVE) ×3 IMPLANT
GOWN STRL REUS W/TWL LRG LVL3 (GOWN DISPOSABLE) ×3 IMPLANT
GRASPER SUT TROCAR 14GX15 (MISCELLANEOUS) ×3 IMPLANT
IV NS 1000ML (IV SOLUTION) ×2
IV NS 1000ML BAXH (IV SOLUTION) ×1 IMPLANT
LIQUID BAND (GAUZE/BANDAGES/DRESSINGS) ×3 IMPLANT
NEEDLE HYPO 22GX1.5 SAFETY (NEEDLE) ×3 IMPLANT
PACK BASIN DAY SURGERY FS (CUSTOM PROCEDURE TRAY) ×3 IMPLANT
POUCH RETRIEVAL ECOSAC 10 (ENDOMECHANICALS) ×1 IMPLANT
POUCH RETRIEVAL ECOSAC 10MM (ENDOMECHANICALS) ×2
RELOAD 45 VASCULAR/THIN (ENDOMECHANICALS) IMPLANT
RELOAD STAPLE TA45 3.5 REG BLU (ENDOMECHANICALS) IMPLANT
SCISSORS LAP 5X35 DISP (ENDOMECHANICALS) IMPLANT
SET IRRIG TUBING LAPAROSCOPIC (IRRIGATION / IRRIGATOR) IMPLANT
SHEARS HARMONIC ACE PLUS 36CM (ENDOMECHANICALS) IMPLANT
SOLUTION ANTI FOG 6CC (MISCELLANEOUS) ×3 IMPLANT
STOPCOCK 4 WAY LG BORE MALE ST (IV SETS) IMPLANT
SUT ETHILON 2 0 PS N (SUTURE) IMPLANT
SUT MNCRL AB 4-0 PS2 18 (SUTURE) ×3 IMPLANT
SUT VICRYL 0 ENDOLOOP (SUTURE) IMPLANT
SUT VICRYL 0 UR6 27IN ABS (SUTURE) ×6 IMPLANT
SYR 20CC LL (SYRINGE) IMPLANT
SYR CONTROL 10ML LL (SYRINGE) ×3 IMPLANT
TOWEL OR 17X24 6PK STRL BLUE (TOWEL DISPOSABLE) ×6 IMPLANT
TROCAR BLADELESS OPT 12M 100M (ENDOMECHANICALS) ×3 IMPLANT
TROCAR BLADELESS OPT 5 100 (ENDOMECHANICALS) ×9 IMPLANT
TROCAR XCEL 12X100 BLDLESS (ENDOMECHANICALS) ×3 IMPLANT
TROCAR Z-THREAD FIOS 5X100MM (TROCAR) ×9 IMPLANT
TUBING INSUF HEATED (TUBING) ×3 IMPLANT

## 2016-04-29 NOTE — Anesthesia Procedure Notes (Signed)
Procedure Name: Intubation Date/Time: 04/29/2016 11:36 AM Performed by: Jessica PriestBEESON, Misty Parenteau C Pre-anesthesia Checklist: Patient identified, Emergency Drugs available, Suction available and Patient being monitored Patient Re-evaluated:Patient Re-evaluated prior to inductionOxygen Delivery Method: Circle system utilized Preoxygenation: Pre-oxygenation with 100% oxygen Intubation Type: IV induction Ventilation: Mask ventilation without difficulty Laryngoscope Size: Mac and 4 Grade View: Grade III Tube type: Oral Tube size: 7.0 mm Number of attempts: 1 Airway Equipment and Method: Stylet and Oral airway Placement Confirmation: ETT inserted through vocal cords under direct vision,  positive ETCO2 and breath sounds checked- equal and bilateral Secured at: 24 cm Tube secured with: Tape Dental Injury: Teeth and Oropharynx as per pre-operative assessment  Comments: Ramped shoulders and neck/ head using pillows / yellow gel pillow for optimum positioning for induction / intubation - Pre O2 well. Difficult to visualize class 3 view. ETT placed x 1 attempt BBS

## 2016-04-29 NOTE — Interval H&P Note (Signed)
History and Physical Interval Note:  04/29/2016 10:22 AM  Misty Hendrix  has presented today for surgery, with the diagnosis of Chronic cholecystitis  The various methods of treatment have been discussed with the patient and family. After consideration of risks, benefits and other options for treatment, the patient has consented to  Procedure(s): LAPAROSCOPIC CHOLECYSTECTOMY (N/A) as a surgical intervention .  The patient's history has been reviewed, patient examined, no change in status, stable for surgery.  I have reviewed the patient's chart and labs.  Questions were answered to the patient's satisfaction.    Due to multiple comorbidities we will plan on overnight stay for oxygen/cardiac monitoring, glucose control and pain control.  De BlanchLuke Aaron Jash Wahlen

## 2016-04-29 NOTE — Transfer of Care (Signed)
Immediate Anesthesia Transfer of Care Note  Patient: Misty Hendrix  Procedure(s) Performed: Procedure(s) (LRB): LAPAROSCOPIC CHOLECYSTECTOMY (N/A)  Patient Location: PACU  Anesthesia Type: General  Level of Consciousness: awake, sedated, patient cooperative and responds to stimulation  Airway & Oxygen Therapy: Patient Spontanous Breathing and Patient connected to face mask oxygen  Post-op Assessment: Report given to PACU RN, Post -op Vital signs reviewed and stable and Patient moving all extremities  Post vital signs: Reviewed and stable  Complications: No apparent anesthesia complications

## 2016-04-29 NOTE — H&P (Signed)
History of Present Illness Misty Carl MD; 04/01/2016 12:08 PM) The patient is a 55 year old female who presents for evaluation of gall stones. The patient has a three-month history of right upper quadrant abdominal pain. She notes that radiates to the back. She also associated nausea and vomiting. She says she has vomiting approximately 1-2 times per month. She also has diarrhea occasionally especially when eating fatty foods. She also notes having reflux-type symptoms and epigastric pain for which takes Nexium. Since having this worsening abdominal pain she has flushed what she is eating and has lost 4 pounds since that time. She now avoids high-fat foods and is using less milk products as well. She has not been to the ER for this type of pain area and she denies any fevers or chills.  She has a 12 year history of diabetes for which she takes multiple oral medications as well as Bydureon. She has no history of stroke or myocardial infarction. She does note occasional numbness in her toes and does see a physician for toe management. Her highest A1c was 14, her last A1c was 7.2 per patient.   Other Problems Fay Records, CMA; 04/01/2016 11:09 AM) Anxiety Disorder Arthritis Cholelithiasis Depression Diabetes Mellitus Gastroesophageal Reflux Disease Hypercholesterolemia Sleep Apnea  Past Surgical History Fay Records, CMA; 04/01/2016 11:09 AM) Cesarean Section - 1  Diagnostic Studies History Fay Records, CMA; 04/01/2016 11:09 AM) Colonoscopy never Pap Smear >5 years ago  Allergies Fay Records, CMA; 04/01/2016 11:10 AM) Olmesartan Medoxomil *ANTIHYPERTENSIVES* Robitussin Cough/Cold Long-Act *COUGH/COLD/ALLERGY* Dextromethorphan *CHEMICALS*  Medication History Fay Records, CMA; 04/01/2016 11:14 AM) Alma Friendly (100MG  Tablet, Oral) Active. Fluconazole (150MG  Tablet, Oral) Active. Gabapentin (400MG  Capsule, Oral two times daily) Active. MetFORMIN HCl (1000MG   Tablet, Oral two times daily) Active. Furosemide (20MG  Tablet, Oral two times daily) Active. ALPRAZolam (0.5MG  Tablet, Oral) Active. Naprelan (500MG  Tablet ER, Oral) Active. Crestor (20MG  Tablet, Oral) Active. NexIUM (40MG  Capsule DR, Oral) Active. Valsartan (80MG  Tablet, Oral) Active. Medications Reconciled  Social History Fay Records, [place]; 04/01/2016 11:09 AM) Alcohol use Remotely quit alcohol use. Caffeine use Coffee, Tea.  Family History Fay Records, [place]; 04/01/2016 11:09 AM) Alcohol Abuse Father. Arthritis Brother, Father, Mother. Bleeding disorder Mother. Depression Mother. Heart Disease Father, Mother. Hypertension Mother. Ischemic Bowel Disease Father. Prostate Cancer Father.  Pregnancy / Birth History Fay Records, CMA; 04/01/2016 11:09 AM) Age at menarche 11 years. Gravida 2 Irregular periods Maternal age 91-20 Para 2    Review of Systems Fay Records CMA; 04/01/2016 11:09 AM) General Present- Weight Gain and Weight Loss. Not Present- Appetite Loss, Chills, Fatigue, Fever and Night Sweats. Skin Not Present- Change in Wart/Mole, Dryness, Hives, Jaundice, New Lesions, Non-Healing Wounds, Rash and Ulcer. HEENT Present- Seasonal Allergies, Sinus Pain and Wears glasses/contact lenses. Not Present- Earache, Hearing Loss, Hoarseness, Nose Bleed, Oral Ulcers, Ringing in the Ears, Sore Throat, Visual Disturbances and Yellow Eyes. Respiratory Present- Snoring and Wheezing. Not Present- Bloody sputum, Chronic Cough and Difficulty Breathing. Breast Not Present- Breast Mass, Breast Pain, Nipple Discharge and Skin Changes. Gastrointestinal Present- Abdominal Pain, Bloating, Indigestion, Nausea and Vomiting. Not Present- Bloody Stool, Change in Bowel Habits, Chronic diarrhea, Constipation, Difficulty Swallowing, Excessive gas, Gets full quickly at meals, Hemorrhoids and Rectal Pain. Female Genitourinary Not Present- Frequency, Nocturia, Painful Urination, Pelvic  Pain and Urgency. Musculoskeletal Present- Joint Pain. Not Present- Back Pain, Joint Stiffness, Muscle Pain, Muscle Weakness and Swelling of Extremities. Neurological Present- Numbness. Not Present- Decreased Memory, Fainting, Headaches, Seizures, Tingling, Tremor, Trouble walking and Weakness.  Psychiatric Present- Anxiety, Change in Sleep Pattern and Depression. Not Present- Bipolar, Fearful and Frequent crying. Endocrine Present- Hot flashes. Not Present- Cold Intolerance, Excessive Hunger, Hair Changes, Heat Intolerance and New Diabetes. Hematology Not Present- Easy Bruising, Excessive bleeding, Gland problems, HIV and Persistent Infections.  Vitals Fay Records CMA; 04/01/2016 11:14 AM) 04/01/2016 11:14 AM Weight: 307 lb Temp.: 97.38F  Pulse: 110 (Regular)  BP: 140/80 (Sitting, Left Arm, Standard)       Physical Exam Misty Carl, MD; 04/01/2016 12:12 PM) General Mental Status-Alert. General Appearance-Cooperative. Orientation-Oriented X4. Posture-Normal posture.  Integumentary Global Assessment Normal Exam - Head/Face: no rashes, ulcers, lesions or evidence of photo damage. No palpable nodules or masses and Neck: no visible lesions or palpable masses.  Head and Neck Head-normocephalic, atraumatic with no lesions or palpable masses. Face Global Assessment - atraumatic. Thyroid Gland Characteristics - normal size and consistency.  Eye Eyeball - Bilateral-Extraocular movements intact. Sclera/Conjunctiva - Bilateral-No scleral icterus, No Discharge.  ENMT Nose and Sinuses Nose - no deformities observed, no swelling present.  Chest and Lung Exam Palpation Normal exam - Non-tender. Auscultation Breath sounds - Normal.  Cardiovascular Auscultation Rhythm - Regular. Heart Sounds - S1 WNL and S2 WNL. Carotid arteries - No Carotid bruit.  Abdomen Inspection Normal Exam - No Visible peristalsis, No Abnormal pulsations and No Paradoxical  movements. Palpation/Percussion Normal exam - Soft, No Rebound tenderness, No Rigidity (guarding), No hepatosplenomegaly and No Palpable abdominal masses. Tenderness - Right Upper Quadrant. Gallbladder - Negative Murphy's sign.  Peripheral Vascular Upper Extremity Palpation - Pulses bilaterally normal. Lower Extremity Palpation - Edema - Bilateral - No edema.  Neurologic Neurologic evaluation reveals -normal sensation and normal coordination.  Neuropsychiatric Mental status exam performed with findings of-able to articulate well with normal speech/language, rate, volume and coherence and thought content normal with ability to perform basic computations and apply abstract reasoning.  Musculoskeletal Normal Exam - Bilateral-Upper Extremity Strength Normal and Lower Extremity Strength Normal.    Assessment & Plan Misty Carl MD; 04/01/2016 12:11 PM) GALL BLADDER STONES (K80.20) Impression: 55 year old female with multiple medical problems that has epigastric pain as well as right upper quadrant abdominal pain radiating to the right side of back. US show shows positive stones without gallbladder wall thickening. I do think a fair amount of her abdominal pain and abdominal complaints are related to gallbladder disease. However I do think that she does have some baseline reflux disease as well. I discussed this with her and that she may have some reflux and nausea afterwards unrelated to biliary disease. We discussed the etiology of gallstones and probably cause pain. We discussed exacerbating factors including fatty meals. We discussed the details of surgery for removal of the gallbladder including general anesthesia, 4 small incisions in the patient's abdomen, removal of the patient's gallbladder with the liver and common bile duct, and most likely outpatient procedure. We discussed risks of common bile duct injury, cystic duct stump leak, injury to liver, bleeding, infection, need for  open procedure, and this cholecystectomy syndrome. The patient showed good understanding and wanted to proceed with laparoscopic cholecystectomy. Current Plans The anatomy & physiology of hepatobiliary & pancreatic function was discussed. The pathophysiology of gallbladder dysfunction was discussed. Natural history risks without surgery was discussed. I feel the risks of no intervention will lead to serious problems that outweigh the operative risks; therefore, I recommended cholecystectomy to remove the pathology. I explained laparoscopic techniques with possible need for an open approach. Probable cholangiogram to evaluate the  bilary tract was explained as well.  Risks such as bleeding, infection, abscess, leak, injury to other organs, need for further treatment, heart attack, death, and other risks were discussed. I noted a good likelihood this will help address the problem. Possibility that this will not correct all abdominal symptoms was explained. Goals of post-operative recovery were discussed as well. We will work to minimize complications. An educational handout further explaining the pathology and treatment options was given as well. Questions were answered. The patient expresses understanding & wishes to proceed with surgery.  Pt Education - Laparoscopic Cholecystectomy: gallbladder Pt Education - Pamphlet Given - Laparoscopic Gallbladder Surgery: discussed with patient and provided information. You are being scheduled for surgery - Our schedulers will call you.  You should hear from our office's scheduling department within 5 working days about the location, date, and time of surgery. We try to make accommodations for patient's preferences in scheduling surgery, but sometimes the OR schedule or the surgeon's schedule prevents us from making those accommodations.  If you have not heard from our office [phone]) in 5 working days, call the office and ask for your surgeon's nurse.  If you  have other questions about your diagnosis, plan, or surgery, call the office and ask for your surgeon's nurse.  MORBID OBESITY (E66.01) Impression: Christella Scheuermannda has a long history of obesity. Currently she has modified her diet substantially in order to control her abdominal complaints as well as control her diabetes. She notes a level of education regarding nutrition and that she is actively learning about better things to eat. I discussed that we deal with weight loss frequently, and would be happy to recommend her to a dietitian for further education. DIABETES MELLITUS (E11.9) Impression: Christella Scheuermannda is managing her diabetes with multiple insulin based medications as well as diet modification with last a1c within an appropriate range. She denies any current symptoms of active cardiac disease and has no history of MI or stroke.

## 2016-04-29 NOTE — Op Note (Signed)
PATIENT:  Misty ShoemakerAda D Hendrix  55 y.o. female  PRE-OPERATIVE DIAGNOSIS:  Chronic cholecystitis  POST-OPERATIVE DIAGNOSIS:  Chronic cholecystitis  PROCEDURE:  Procedure(s): LAPAROSCOPIC CHOLECYSTECTOMY   SURGEON:  Surgeon(s): De BlanchLuke Aaron Kinsinger, MD  ASSISTANT: none  ANESTHESIA:   local and general  Indications for procedure: Misty Shoemakerda D Keddy is a 55 y.o. female with symptoms of Abdominal pain and RUQ pain consistent with gallbladder disease, Confirmed by Ultrasound.  Description of procedure: The patient was brought into the operative suite, placed supine. Anesthesia was administered with endotracheal tube. Patient was strapped in place and foot board was secured. All pressure points were offloaded by foam padding. The patient was prepped and draped in the usual sterile fashion.  A small incision was made to the right of the umbilicus. A 5mm trocar was inserted into the peritoneal cavity with optical entry. Pneumoperitoneum was applied with high flow low pressure. 2 5mm trocars were placed in the RUQ. A 12mm trocar was placed in the subxiphoid space. All trocars sites were first anesthesized with 0.25% marcaine with epinephrine in the subcutaneous and preperitoneal layers. Next the patient was placed in reverse trendelenberg. There were multiple adhesions of the omentum to the gallbladder. These were taken down with cautery.   The gallbladder was retracted cephalad and lateral. The peritoneum was reflected off the infundibulum working lateral to medial. Due to her habitus there was difficulty with retracting the liver and visualizing the infundibulum but after dissected the peritoneal reflection free at each edge this was accomplished. The cystic duct and cystic artery were identified and further dissection revealed a critical view. The cystic duct and cystic artery were doubly clipped and ligated. The infundibulum had a hole that allowed small stones to escape. All stones were retrieved with the  suction device.  The gallbladder was removed off the liver bed with cautery. The Gallbladder was placed in a specimen bag. The gallbladder fossa was irrigated and hemostasis was applied with cautery. The gallbladder was removed via the 12mm trocar. The fascial defect was closed with interrupted 0 vicryl suture via laparoscopic trans-fascial suture passer. Pneumoperitoneum was removed, all trocar were removed. All incisions were closed with 4-0 monocryl subcuticular stitch. The patient woke from anesthesia and was brought to PACU in stable condition. All counts were correct  Findings: inflamed gallbladder with multiple stones  Specimen: gallbladder  Blood loss: Total I/O In: 1000 [I.V.:1000] Out: -  ml  Local anesthesia: 30 ml 0.25% marcaine with epinephrine  Complications: none  PLAN OF CARE: Admit for overnight observation  PATIENT DISPOSITION:  PACU - hemodynamically stable.  Feliciana RossettiLuke Kinsinger, M.D. General, Bariatric, & Minimally Invasive Surgery [facility]

## 2016-04-30 ENCOUNTER — Encounter (HOSPITAL_BASED_OUTPATIENT_CLINIC_OR_DEPARTMENT_OTHER): Payer: Self-pay | Admitting: General Surgery

## 2016-04-30 DIAGNOSIS — K8010 Calculus of gallbladder with chronic cholecystitis without obstruction: Secondary | ICD-10-CM | POA: Diagnosis not present

## 2016-04-30 LAB — GLUCOSE, CAPILLARY: GLUCOSE-CAPILLARY: 127 mg/dL — AB (ref 65–99)

## 2016-04-30 MED ORDER — HYDROCODONE-ACETAMINOPHEN 5-325 MG PO TABS: 1.0000 | ORAL_TABLET | Freq: Four times a day (QID) | ORAL | Status: DC | PRN

## 2016-04-30 NOTE — Discharge Summary (Signed)
Physician Discharge Summary  Patient ID: Misty Hendrix MRN: [id number] DOB/AGE: 57/05/1961 57 y.o.  Admit date: 04/29/2016 Discharge date: 04/30/2016  Admission Diagnoses:  Discharge Diagnoses:  Active Problems:   Chronic cholecystitis with calculus   Discharged Condition: good  Hospital Course: Misty Hendrix came to the hospital, underwent lap chole and was watched overnight due to OSA, DM, heart risk. She did well, ambulated POD 0, tolerated a diet, took lovenox to decrease VTE risk, and pain was under control with oral agents. She was discharged home POD 1  Consults: None  Significant Diagnostic Studies: none  Treatments: lap chole  Discharge Exam: Blood pressure 114/59, pulse 82, temperature 98.5 F (36.9 C), temperature source Oral, resp. rate 18, height 5\' 6"  (1.676 m), weight 137.44 kg (303 lb), SpO2 96 %. General appearance: alert and cooperative Head: Normocephalic, without obvious abnormality, atraumatic Neck: no adenopathy, no carotid bruit, no JVD, supple, symmetrical, trachea midline and thyroid not enlarged, symmetric, no tenderness/mass/nodules Resp: clear to auscultation bilaterally Cardio: regular rate and rhythm, S1, S2 normal, no murmur, click, rub or gallop GI: soft, non-tender; bowel sounds normal; no masses,  no organomegaly and wounds c/d/i  Disposition: 01-Home or Self Care  Discharge Instructions    Call MD for:  difficulty breathing, headache or visual disturbances    Complete by:  As directed      Call MD for:  persistant nausea and vomiting    Complete by:  As directed      Call MD for:  redness, tenderness, or signs of infection (pain, swelling, redness, odor or green/yellow discharge around incision site)    Complete by:  As directed      Call MD for:  severe uncontrolled pain    Complete by:  As directed      Call MD for:  temperature >100.4    Complete by:  As directed      Diet - low sodium heart healthy    Complete by:  As directed      Discharge  wound care:    Complete by:  As directed   Ok to shower tomorrow  Glue will likely peel off in 1-3 weeks     Increase activity slowly    Complete by:  As directed      Lifting restrictions    Complete by:  As directed   Do not lift more than 20 pounds for 3-4 weeks            Medication List    TAKE these medications        albuterol 108 (90 Base) MCG/ACT inhaler  Commonly known as:  PROVENTIL HFA;VENTOLIN HFA  Inhale 2 puffs into the lungs every 6 (six) hours as needed for wheezing or shortness of breath.     ALPRAZolam 0.5 MG tablet  Commonly known as:  XANAX  Take 0.5 mg by mouth at bedtime as needed for anxiety.     beclomethasone 80 MCG/ACT inhaler  Commonly known as:  QVAR  Inhale 1 puff into the lungs daily as needed (for shortness of breath). For shortness of breath     BYDUREON [place]  Inject 1 Dose into the skin once a week.     cycloSPORINE 0.05 % ophthalmic emulsion  Commonly known as:  RESTASIS  Place 1 drop into both eyes daily as needed (dry eyes).     esomeprazole 40 MG capsule  Commonly known as:  NEXIUM  Take 40 mg by mouth daily before breakfast.  fluconazole 150 MG tablet  Commonly known as:  DIFLUCAN  Take 150 mg by mouth daily as needed. For yeast     furosemide 20 MG tablet  Commonly known as:  LASIX  Take 20-40 mg by mouth daily as needed for fluid.     gabapentin 800 MG tablet  Commonly known as:  NEURONTIN  Take 1 tablet (800 mg total) by mouth 3 (three) times daily.     HYDROcodone-acetaminophen 5-325 MG tablet  Commonly known as:  NORCO/VICODIN  Take 1 tablet by mouth every 6 (six) hours as needed for moderate pain.     loratadine 10 MG tablet  Commonly known as:  CLARITIN  Take 10 mg by mouth daily as needed for allergies.     naproxen 500 MG tablet  Commonly known as:  NAPROSYN  Take 500 mg by mouth as needed for mild pain.     rosuvastatin 20 MG tablet  Commonly known as:  CRESTOR  Take 20 mg by mouth daily.      sitaGLIPtin 50 MG tablet  Commonly known as:  JANUVIA  Take 50 mg by mouth daily.     valsartan 80 MG tablet  Commonly known as:  DIOVAN  Take 80 mg by mouth daily.     VISINE OP  Apply 1 drop to eye daily as needed (dry eyes).           Follow-up Information    Follow up with Rodman Pickle, MD In 3 weeks.   Specialty:  General Surgery   Contact information:   [address] [place] [address] [phone]       Signed: De Blanch Fauna Neuner 04/30/2016, 8:24 AM

## 2016-04-30 NOTE — Anesthesia Postprocedure Evaluation (Signed)
Anesthesia Post Note  Patient: Misty Hendrix  Procedure(s) Performed: Procedure(s) (LRB): LAPAROSCOPIC CHOLECYSTECTOMY (N/A)  Patient location during evaluation: PACU Anesthesia Type: General Level of consciousness: sedated and patient cooperative Pain management: pain level controlled Vital Signs Assessment: post-procedure vital signs reviewed and stable Respiratory status: spontaneous breathing Cardiovascular status: stable Anesthetic complications: no    Last Vitals:  Filed Vitals:   04/30/16 0220 04/30/16 0510  BP: 120/66 114/59  Pulse: 87 82  Temp: 36.9 C 36.9 C  Resp: 20 18    Last Pain:  Filed Vitals:   04/30/16 0752  PainSc: 0-No pain                 Lewie LoronJohn Reiley Keisler

## 2016-11-18 ENCOUNTER — Telehealth: Payer: Self-pay

## 2016-11-18 ENCOUNTER — Telehealth: Payer: Self-pay | Admitting: Physician Assistant

## 2016-11-18 NOTE — Telephone Encounter (Signed)
Valsartan is the generic of Diovan.  So can the brand just be sent in place of it?

## 2016-11-18 NOTE — Telephone Encounter (Signed)
Patient was requesting a refill on valsartan and she states that she originally had one. What patient needs is a prior authorization. Can you please work on this for patient

## 2016-11-19 ENCOUNTER — Telehealth: Payer: Self-pay | Admitting: Physician Assistant

## 2016-11-19 ENCOUNTER — Telehealth: Payer: Self-pay

## 2016-11-19 NOTE — Telephone Encounter (Signed)
In process with [facility]

## 2016-11-19 NOTE — Telephone Encounter (Signed)
Ill try it

## 2016-11-19 NOTE — Telephone Encounter (Signed)
Please advise 

## 2016-11-19 NOTE — Telephone Encounter (Signed)
In past tried and failed lisinopril and losartan.

## 2016-12-17 ENCOUNTER — Other Ambulatory Visit: Payer: Self-pay | Admitting: *Deleted

## 2016-12-17 MED ORDER — ALPRAZOLAM 0.5 MG PO TABS: 0.5000 mg | ORAL_TABLET | Freq: Every evening | ORAL | 0 refills | Status: DC | PRN

## 2016-12-17 NOTE — Telephone Encounter (Signed)
What is the question? 

## 2016-12-17 NOTE — Telephone Encounter (Signed)
Sorry - Lawanna KobusAngel, she needs a refill on Xanax  If approved - have nurse phone in

## 2016-12-17 NOTE — Telephone Encounter (Signed)
Phoned in.

## 2016-12-27 ENCOUNTER — Other Ambulatory Visit: Payer: Self-pay | Admitting: Physician Assistant

## 2016-12-30 ENCOUNTER — Other Ambulatory Visit: Payer: Self-pay | Admitting: Physician Assistant

## 2016-12-31 NOTE — Telephone Encounter (Signed)
Rx called in. Left message on vm- rx called into pharmacy and needs to be seen before any more refills.

## 2017-01-25 ENCOUNTER — Other Ambulatory Visit: Payer: Self-pay | Admitting: Physician Assistant

## 2017-01-27 ENCOUNTER — Telehealth: Payer: Self-pay | Admitting: *Deleted

## 2017-02-03 NOTE — Telephone Encounter (Signed)
With out her being seen here and under my treatment I will not be able to do any refills at this time.   Let her know I will miss her but understand that she cannot come.

## 2017-02-25 NOTE — Telephone Encounter (Signed)
Attempted to contact patient - both numbers not working. Several attempts have been made to patient. This encounter will now be closed.

## 2017-03-15 ENCOUNTER — Other Ambulatory Visit: Payer: Self-pay | Admitting: Physician Assistant

## 2017-06-06 ENCOUNTER — Other Ambulatory Visit: Payer: Self-pay | Admitting: *Deleted

## 2017-07-09 ENCOUNTER — Emergency Department (HOSPITAL_COMMUNITY): Payer: Medicaid Other

## 2017-07-09 ENCOUNTER — Encounter (HOSPITAL_COMMUNITY): Payer: Self-pay | Admitting: Emergency Medicine

## 2017-07-09 ENCOUNTER — Emergency Department (HOSPITAL_COMMUNITY)
Admission: EM | Admit: 2017-07-09 | Discharge: 2017-07-09 | Disposition: A | Discharge status: Home / Self Care | Payer: Medicaid Other | Attending: Emergency Medicine | Admitting: Emergency Medicine

## 2017-07-09 DIAGNOSIS — Z7984 Long term (current) use of oral hypoglycemic drugs: Secondary | ICD-10-CM | POA: Insufficient documentation

## 2017-07-09 DIAGNOSIS — Z79899 Other long term (current) drug therapy: Secondary | ICD-10-CM | POA: Insufficient documentation

## 2017-07-09 DIAGNOSIS — I10 Essential (primary) hypertension: Secondary | ICD-10-CM | POA: Diagnosis not present

## 2017-07-09 DIAGNOSIS — R197 Diarrhea, unspecified: Secondary | ICD-10-CM | POA: Diagnosis not present

## 2017-07-09 DIAGNOSIS — R109 Unspecified abdominal pain: Secondary | ICD-10-CM

## 2017-07-09 DIAGNOSIS — R1084 Generalized abdominal pain: Secondary | ICD-10-CM | POA: Diagnosis present

## 2017-07-09 DIAGNOSIS — Z87891 Personal history of nicotine dependence: Secondary | ICD-10-CM | POA: Insufficient documentation

## 2017-07-09 DIAGNOSIS — E119 Type 2 diabetes mellitus without complications: Secondary | ICD-10-CM | POA: Insufficient documentation

## 2017-07-09 DIAGNOSIS — J45909 Unspecified asthma, uncomplicated: Secondary | ICD-10-CM | POA: Insufficient documentation

## 2017-07-09 HISTORY — DX: Radiculopathy, lumbar region: M54.16

## 2017-07-09 LAB — CBC WITH DIFFERENTIAL/PLATELET
BASOS ABS: 0 10*3/uL (ref 0.0–0.1)
Basophils Relative: 0 %
EOS ABS: 0.1 10*3/uL (ref 0.0–0.7)
Eosinophils Relative: 1 %
HCT: 38.7 % (ref 36.0–46.0)
HEMOGLOBIN: 12.7 g/dL (ref 12.0–15.0)
Lymphocytes Relative: 30 %
Lymphs Abs: 2.8 10*3/uL (ref 0.7–4.0)
MCH: 29.8 pg (ref 26.0–34.0)
MCHC: 32.8 g/dL (ref 30.0–36.0)
MCV: 90.8 fL (ref 78.0–100.0)
MONOS PCT: 5 %
Monocytes Absolute: 0.5 10*3/uL (ref 0.1–1.0)
Neutro Abs: 5.9 10*3/uL (ref 1.7–7.7)
Neutrophils Relative %: 64 %
Platelets: 314 10*3/uL (ref 150–400)
RBC: 4.26 MIL/uL (ref 3.87–5.11)
RDW: 14.4 % (ref 11.5–15.5)
WBC: 9.3 10*3/uL (ref 4.0–10.5)

## 2017-07-09 LAB — COMPREHENSIVE METABOLIC PANEL
ALBUMIN: 4.9 g/dL (ref 3.5–5.0)
ALK PHOS: 104 U/L (ref 38–126)
ALT: 60 U/L — ABNORMAL HIGH (ref 14–54)
ANION GAP: 11 (ref 5–15)
AST: 61 U/L — ABNORMAL HIGH (ref 15–41)
BILIRUBIN TOTAL: 0.9 mg/dL (ref 0.3–1.2)
BUN: 15 mg/dL (ref 6–20)
CALCIUM: 9.6 mg/dL (ref 8.9–10.3)
CO2: 28 mmol/L (ref 22–32)
CREATININE: 0.81 mg/dL (ref 0.44–1.00)
Chloride: 100 mmol/L — ABNORMAL LOW (ref 101–111)
GFR calc Af Amer: >60 mL/min (ref >60)
GFR calc non Af Amer: >60 mL/min (ref >60)
GLUCOSE: 132 mg/dL — AB (ref 65–99)
Potassium: 3.7 mmol/L (ref 3.5–5.1)
Sodium: 139 mmol/L (ref 135–145)
TOTAL PROTEIN: 8.2 g/dL — AB (ref 6.5–8.1)

## 2017-07-09 LAB — URINALYSIS, ROUTINE W REFLEX MICROSCOPIC
BILIRUBIN URINE: NEGATIVE
Bacteria, UA: NONE SEEN
GLUCOSE, UA: NEGATIVE mg/dL
HGB URINE DIPSTICK: NEGATIVE
Ketones, ur: 80 mg/dL — AB
LEUKOCYTES UA: NEGATIVE
NITRITE: NEGATIVE
PROTEIN: 30 mg/dL — AB
Specific Gravity, Urine: 1.026 (ref 1.005–1.030)
pH: 5 (ref 5.0–8.0)

## 2017-07-09 LAB — LIPASE, BLOOD: Lipase: 32 U/L (ref 11–51)

## 2017-07-09 MED ORDER — DICYCLOMINE HCL 20 MG PO TABS: 20.0000 mg | ORAL_TABLET | Freq: Four times a day (QID) | ORAL | 0 refills | Status: DC | PRN

## 2017-07-09 MED ORDER — DICYCLOMINE HCL 10 MG/ML IM SOLN
20.0000 mg | Freq: Once | INTRAMUSCULAR | Status: AC
  Administered 2017-07-09: 20 mg via INTRAMUSCULAR
  Filled 2017-07-09: qty 2

## 2017-07-09 MED ORDER — SODIUM CHLORIDE 0.9 % IV SOLN
INTRAVENOUS | Status: DC
  Administered 2017-07-09: 16:00:00 via INTRAVENOUS

## 2017-07-09 MED ORDER — ONDANSETRON HCL 4 MG/2ML IJ SOLN
4.0000 mg | INTRAMUSCULAR | Status: DC | PRN
  Administered 2017-07-09: 4 mg via INTRAVENOUS
  Filled 2017-07-09: qty 2

## 2017-07-09 MED ORDER — IOPAMIDOL (ISOVUE-300) INJECTION 61%
100.0000 mL | Freq: Once | INTRAVENOUS | Status: AC | PRN
  Administered 2017-07-09: 100 mL via INTRAVENOUS

## 2017-07-09 MED ORDER — FAMOTIDINE IN NACL 20-0.9 MG/50ML-% IV SOLN
20.0000 mg | Freq: Once | INTRAVENOUS | Status: AC
  Administered 2017-07-09: 20 mg via INTRAVENOUS
  Filled 2017-07-09: qty 50

## 2017-07-09 MED ORDER — ONDANSETRON 4 MG PO TBDP: 4.0000 mg | ORAL_TABLET | Freq: Three times a day (TID) | ORAL | 0 refills | Status: DC | PRN

## 2017-07-09 NOTE — Discharge Instructions (Signed)
Take the prescriptions as directed.  Increase your fluid intake (ie:  Gatoraide) for the next few days.  Eat a bland diet and advance to your regular diet slowly as you can tolerate it.  Avoid full strength juices, as well as milk and milk products until your diarrhea has resolved.   Call your regular medical doctor Monday to schedule a follow up appointment in the next 3 days.  Return to the Emergency Department immediately if not improving (or even worsening) despite taking the medicines as prescribed, any black or bloody stool or vomit, if you develop a fever over "101," or for any other concerns. ° °

## 2017-07-09 NOTE — ED Notes (Signed)
Pt given water to drink. Pt tolerating well at this time. 

## 2017-07-09 NOTE — ED Notes (Signed)
Pt transported to CT ?

## 2017-07-09 NOTE — ED Triage Notes (Signed)
Pt reports generalized abdominal pain that radiates to back with nausea and diarrhea x 2 days. States she has had 3 episodes of diarrhea in the past 24 hours. No fever.

## 2017-07-09 NOTE — ED Provider Notes (Signed)
[facility]-EMERGENCY DEPT Provider Note   CSN: [id number] Arrival date & time: 07/09/17  1507     History   Chief Complaint Chief Complaint  Patient presents with  . Abdominal Pain    HPI Misty Hendrix is a 56 y.o. female.  HPI  Pt was seen at 1520.  Per pt, c/o gradual onset and persistence of constant generalized abd "pain" for the past 2 days.  Has been associated with multiple intermittent episodes of "yellow" diarrhea.  Describes the abd pain as "bloating," with radiation into her back.  Denies vomiting, no fevers, no flank pain, no rash, no CP/SOB, no black or blood in stools.      Past Medical History:  Diagnosis Date  . Anxiety   . Asthma    Dr. Sherene Sires - h/o cough, better off ACE-I  . Back pain, chronic   . Depression   . Diabetes mellitus   . Essential hypertension, benign   . GERD (gastroesophageal reflux disease)   . Lumbar radiculopathy   . Mixed hyperlipidemia   . Obesity   . Obstructive sleep apnea    CPAP-hasn" used since 2015  . Type 2 diabetes mellitus [facility])     Patient Active Problem List   Diagnosis Date Noted  . Chronic cholecystitis with calculus 04/29/2016  . Cervicalgia 09/12/2012  . Pain in limb 09/12/2012  . Preoperative evaluation to rule out surgical contraindication 03/26/2011  . Dyspnea on exertion 03/26/2011  . Morbid obesity (HCC) 03/26/2011  . Type II or unspecified type diabetes mellitus without mention of complication, uncontrolled 03/26/2011  . HYPERLIPIDEMIA 02/18/2010  . Essential hypertension, benign 02/18/2010  . ASTHMA 02/18/2010  . SLEEP APNEA 02/18/2010  . COUGH 02/18/2010    Past Surgical History:  Procedure Laterality Date  . CESAREAN SECTION  1993  . CHOLECYSTECTOMY N/A 04/29/2016   Procedure: LAPAROSCOPIC CHOLECYSTECTOMY;  Surgeon: De Blanch Kinsinger, MD;  Location: [facility];  Service: General;  Laterality: N/A;    OB History    No data available       Home Medications    Prior to  Admission medications   Medication Sig Start Date End Date Taking? Authorizing Provider  albuterol (PROVENTIL HFA;VENTOLIN HFA) 108 (90 BASE) MCG/ACT inhaler Inhale 2 puffs into the lungs every 6 (six) hours as needed for wheezing or shortness of breath.    [provider]  ALPRAZolam Prudy Feeler) 0.5 MG tablet TAKE ONE TABLET BY MOUTH ONCE DAILY AT BEDTIME AS NEEDED 12/31/16   Remus Loffler, PA-C  beclomethasone (QVAR) 80 MCG/ACT inhaler Inhale 1 puff into the lungs daily as needed (for shortness of breath). For shortness of breath    [provider]  cycloSPORINE (RESTASIS) 0.05 % ophthalmic emulsion Place 1 drop into both eyes daily as needed (dry eyes).     [provider]  esomeprazole (NEXIUM) 40 MG capsule Take 40 mg by mouth daily before breakfast.    [provider]  Exenatide (BYDUREON Tuttle) Inject 1 Dose into the skin once a week.    [provider]  fluconazole (DIFLUCAN) 150 MG tablet Take 150 mg by mouth daily as needed. For yeast    [provider]  furosemide (LASIX) 20 MG tablet Take 20-40 mg by mouth daily as needed for fluid.    [provider]  gabapentin (NEURONTIN) 800 MG tablet Take 1 tablet (800 mg total) by mouth 3 (three) times daily. 08/28/15   Anson Fret, MD  HYDROcodone-acetaminophen (NORCO/VICODIN) 5-325 MG  tablet Take 1 tablet by mouth every 6 (six) hours as needed for moderate pain. 04/30/16   Kinsinger, De Blanch, MD  loratadine (CLARITIN) 10 MG tablet Take 10 mg by mouth daily as needed for allergies.    [provider]  naproxen (NAPROSYN) 500 MG tablet Take 500 mg by mouth as needed for mild pain.     [provider]  rosuvastatin (CRESTOR) 20 MG tablet Take 20 mg by mouth daily.    [provider]  sitaGLIPtin (JANUVIA) 50 MG tablet Take 50 mg by mouth daily.    [provider]  Tetrahydrozoline HCl (VISINE OP) Apply 1 drop to eye daily as needed (dry eyes).     [provider]  valsartan (DIOVAN) 80 MG tablet Take 80 mg by mouth daily.     [provider]    Family History Family History  Problem Relation Age of Onset  . Coronary artery disease Mother   . Coronary artery disease Father   . Cancer Brother        Throat  . COPD Paternal Aunt   . Asthma Maternal Uncle     Social History Social History  Substance Use Topics  . Smoking status: Former Smoker    Packs/day: 0.50    Years: 28.00    Types: Cigarettes    Quit date: 11/16/2003  . Smokeless tobacco: Current User     Comment: e-cigarette w/out nicotine occa.  . Alcohol use No     Comment: None since 1991     Allergies   Olmesartan; Lisinopril; Benicar [olmesartan medoxomil]; and Dextromethorphan-guaifenesin   Review of Systems Review of Systems ROS: Statement: All systems negative except as marked or noted in the HPI; Constitutional: Negative for fever and chills. ; ; Eyes: Negative for eye pain, redness and discharge. ; ; ENMT: Negative for ear pain, hoarseness, nasal congestion, sinus pressure and sore throat. ; ; Cardiovascular: Negative for chest pain, palpitations, diaphoresis, dyspnea and peripheral edema. ; ; Respiratory: Negative for cough, wheezing and stridor. ; ; Gastrointestinal: +diarrhea, abd pain. Negative for vomiting, blood in stool, hematemesis, jaundice and rectal bleeding. . ; ; Genitourinary: Negative for dysuria, flank pain and hematuria. ; ; Musculoskeletal: Negative for back pain and neck pain. Negative for swelling and trauma.; ; Skin: Negative for pruritus, rash, abrasions, blisters, bruising and skin lesion.; ; Neuro: Negative for headache, lightheadedness and neck stiffness. Negative for weakness, altered level of consciousness, altered mental status, extremity weakness, paresthesias, involuntary movement, seizure and syncope.       Physical Exam Updated Vital Signs BP 133/69 (BP Location: Right Arm)   Pulse 90   Temp 99.5 F (37.5  C) (Oral)   Resp 18   Ht 5\' 6"  (1.676 m)   Wt 129.3 kg (285 lb)   SpO2 96%   BMI 46.00 kg/m   Physical Exam 1525: Physical examination:  Nursing notes reviewed; Vital signs and O2 SAT reviewed;  Constitutional: Well developed, Well nourished, Well hydrated, In no acute distress; Head:  Normocephalic, atraumatic; Eyes: EOMI, PERRL, No scleral icterus; ENMT: Mouth and pharynx normal, Mucous membranes moist; Neck: Supple, Full range of motion, No lymphadenopathy; Cardiovascular: Regular rate and rhythm, No gallop; Respiratory: Breath sounds clear & equal bilaterally, No wheezes.  Speaking full sentences with ease, Normal respiratory effort/excursion; Chest: Nontender, Movement normal; Abdomen: Soft, +mild diffuse tenderness to palp. No rebound or guarding. Nondistended, Normal bowel sounds; Genitourinary: No CVA tenderness; Extremities: Pulses normal, No tenderness, No edema, No calf  edema or asymmetry.; Neuro: AA&Ox3, Major CN grossly intact.  Speech clear. No gross focal motor or sensory deficits in extremities.; Skin: Color normal, Warm, Dry.   ED Treatments / Results  Labs (all labs ordered are listed, but only abnormal results are displayed)   EKG  EKG Interpretation None       Radiology   Procedures Procedures (including critical care time)  Medications Ordered in ED Medications  dicyclomine (BENTYL) injection 20 mg (not administered)  ondansetron (ZOFRAN) injection 4 mg (not administered)  famotidine (PEPCID) IVPB 20 mg premix (not administered)     Initial Impression / Assessment and Plan / ED Course  I have reviewed the triage vital signs and the nursing notes.  Pertinent labs & imaging results that were available during my care of the patient were reviewed by me and considered in my medical decision making (see chart for details).  MDM Reviewed: previous chart, nursing note and vitals Reviewed previous: labs Interpretation: labs and CT scan    Results for  orders placed or performed during the hospital encounter of 07/09/17  Comprehensive metabolic panel  Result Value Ref Range   Sodium 139 135 - 145 mmol/L   Potassium 3.7 3.5 - 5.1 mmol/L   Chloride 100 (L) 101 - 111 mmol/L   CO2 28 22 - 32 mmol/L   Glucose, Bld 132 (H) 65 - 99 mg/dL   BUN 15 6 - 20 mg/dL   Creatinine, Ser 4.01 0.44 - 1.00 mg/dL   Calcium 9.6 8.9 - 02.7 mg/dL   Total Protein 8.2 (H) 6.5 - 8.1 g/dL   Albumin 4.9 3.5 - 5.0 g/dL   AST 61 (H) 15 - 41 U/L   ALT 60 (H) 14 - 54 U/L   Alkaline Phosphatase 104 38 - 126 U/L   Total Bilirubin 0.9 0.3 - 1.2 mg/dL   GFR calc non Af Amer >60 >60 mL/min   GFR calc Af Amer >60 >60 mL/min   Anion gap 11 5 - 15  Lipase, blood  Result Value Ref Range   Lipase 32 11 - 51 U/L  CBC with Differential  Result Value Ref Range   WBC 9.3 4.0 - 10.5 K/uL   RBC 4.26 3.87 - 5.11 MIL/uL   Hemoglobin 12.7 12.0 - 15.0 g/dL   HCT 25.3 66.4 - 40.3 %   MCV 90.8 78.0 - 100.0 fL   MCH 29.8 26.0 - 34.0 pg   MCHC 32.8 30.0 - 36.0 g/dL   RDW 47.4 25.9 - 56.3 %   Platelets 314 150 - 400 K/uL   Neutrophils Relative % 64 %   Neutro Abs 5.9 1.7 - 7.7 K/uL   Lymphocytes Relative 30 %   Lymphs Abs 2.8 0.7 - 4.0 K/uL   Monocytes Relative 5 %   Monocytes Absolute 0.5 0.1 - 1.0 K/uL   Eosinophils Relative 1 %   Eosinophils Absolute 0.1 0.0 - 0.7 K/uL   Basophils Relative 0 %   Basophils Absolute 0.0 0.0 - 0.1 K/uL  Urinalysis, Routine w reflex microscopic  Result Value Ref Range   Color, Urine YELLOW YELLOW   APPearance CLEAR CLEAR   Specific Gravity, Urine 1.026 1.005 - 1.030   pH 5.0 5.0 - 8.0   Glucose, UA NEGATIVE NEGATIVE mg/dL   Hgb urine dipstick NEGATIVE NEGATIVE   Bilirubin Urine NEGATIVE NEGATIVE   Ketones, ur 80 (A) NEGATIVE mg/dL   Protein, ur 30 (A) NEGATIVE mg/dL   Nitrite NEGATIVE NEGATIVE   Leukocytes, UA  NEGATIVE NEGATIVE   RBC / HPF 0-5 0 - 5 RBC/hpf   WBC, UA 0-5 0 - 5 WBC/hpf   Bacteria, UA NONE SEEN NONE SEEN   Squamous  Epithelial / LPF 0-5 (A) NONE SEEN   Mucus PRESENT    Ct Abdomen Pelvis W Contrast Result Date: 07/09/2017 CLINICAL DATA:  Generalized abdominal pain for 2 days, initial encounter EXAM: CT ABDOMEN AND PELVIS WITH CONTRAST TECHNIQUE: Multidetector CT imaging of the abdomen and pelvis was performed using the standard protocol following bolus administration of intravenous contrast. CONTRAST:  ISOVUE-300 IOPAMIDOL (ISOVUE-300) INJECTION 61% COMPARISON:  None. FINDINGS: Lower chest: No acute abnormality. Hepatobiliary: No focal liver abnormality is seen. Status post cholecystectomy. No biliary dilatation. Pancreas: Unremarkable. No pancreatic ductal dilatation or surrounding inflammatory changes. Spleen: Normal in size without focal abnormality. Adrenals/Urinary Tract: Adrenal glands are unremarkable. Kidneys are normal, without renal calculi, focal lesion, or hydronephrosis. Bladder is decompressed. Stomach/Bowel: Scattered diverticular change of the colon is noted. No changes to suggest diverticulitis are seen. No obstructive or inflammatory changes are noted. The appendix is within normal limits. Vascular/Lymphatic: Aortic atherosclerosis. No enlarged abdominal or pelvic lymph nodes. Reproductive: Uterus and bilateral adnexa are unremarkable. Other: Small fat containing umbilical hernia is noted. Musculoskeletal: Degenerative changes of the lumbar spine are noted. IMPRESSION: Chronic changes without acute abnormality Electronically Signed   By: Alcide Clever M.D.   On: 07/09/2017 16:55   Results for MARCELLE, HEPNER (MRN [id number]) as of 07/09/2017 17:01  Ref. Range 04/26/2016 14:11 07/09/2017 15:25  AST Latest Ref Range: 15 - 41 U/L 53 (H) 61 (H)  ALT Latest Ref Range: 14 - 54 U/L 51 60 (H)     1815:  Pt has tol PO well while in the ED without N/V.  No stooling while in the ED.  Abd benign, VSS. Feels better and wants to go home now. Workup reassuring; tx symptomatically at this time.  Dx and testing  d/w pt.  Questions answered.  Verb understanding, agreeable to d/c home with outpt f/u.     Final Clinical Impressions(s) / ED Diagnoses   Final diagnoses:  None    New Prescriptions New Prescriptions   No medications on file     Samuel Jester, DO 07/14/17 1514

## 2021-04-12 ENCOUNTER — Other Ambulatory Visit: Payer: Self-pay

## 2021-04-12 ENCOUNTER — Inpatient Hospital Stay (HOSPITAL_COMMUNITY)
Admission: EM | Admit: 2021-04-12 | Discharge: 2021-04-16 | DRG: 871 | Disposition: A | Discharge status: Home / Self Care | Payer: Medicaid Other | Attending: Family Medicine | Admitting: Family Medicine

## 2021-04-12 ENCOUNTER — Emergency Department (HOSPITAL_COMMUNITY): Payer: Medicaid Other

## 2021-04-12 ENCOUNTER — Encounter (HOSPITAL_COMMUNITY): Payer: Self-pay | Admitting: *Deleted

## 2021-04-12 DIAGNOSIS — E669 Obesity, unspecified: Secondary | ICD-10-CM | POA: Diagnosis present

## 2021-04-12 DIAGNOSIS — I10 Essential (primary) hypertension: Secondary | ICD-10-CM | POA: Diagnosis present

## 2021-04-12 DIAGNOSIS — Z20822 Contact with and (suspected) exposure to covid-19: Secondary | ICD-10-CM | POA: Diagnosis present

## 2021-04-12 DIAGNOSIS — Z888 Allergy status to other drugs, medicaments and biological substances status: Secondary | ICD-10-CM

## 2021-04-12 DIAGNOSIS — N390 Urinary tract infection, site not specified: Secondary | ICD-10-CM | POA: Diagnosis present

## 2021-04-12 DIAGNOSIS — A4159 Other Gram-negative sepsis: Principal | ICD-10-CM | POA: Diagnosis present

## 2021-04-12 DIAGNOSIS — R21 Rash and other nonspecific skin eruption: Secondary | ICD-10-CM | POA: Diagnosis present

## 2021-04-12 DIAGNOSIS — E441 Mild protein-calorie malnutrition: Secondary | ICD-10-CM

## 2021-04-12 DIAGNOSIS — R7989 Other specified abnormal findings of blood chemistry: Secondary | ICD-10-CM | POA: Diagnosis present

## 2021-04-12 DIAGNOSIS — Z7984 Long term (current) use of oral hypoglycemic drugs: Secondary | ICD-10-CM

## 2021-04-12 DIAGNOSIS — Z79899 Other long term (current) drug therapy: Secondary | ICD-10-CM

## 2021-04-12 DIAGNOSIS — Z6835 Body mass index (BMI) 35.0-35.9, adult: Secondary | ICD-10-CM

## 2021-04-12 DIAGNOSIS — Z87891 Personal history of nicotine dependence: Secondary | ICD-10-CM

## 2021-04-12 DIAGNOSIS — E0810 Diabetes mellitus due to underlying condition with ketoacidosis without coma: Secondary | ICD-10-CM

## 2021-04-12 DIAGNOSIS — L03115 Cellulitis of right lower limb: Secondary | ICD-10-CM | POA: Diagnosis present

## 2021-04-12 DIAGNOSIS — R6520 Severe sepsis without septic shock: Secondary | ICD-10-CM | POA: Diagnosis present

## 2021-04-12 DIAGNOSIS — G9341 Metabolic encephalopathy: Secondary | ICD-10-CM | POA: Diagnosis present

## 2021-04-12 DIAGNOSIS — L0291 Cutaneous abscess, unspecified: Secondary | ICD-10-CM | POA: Diagnosis present

## 2021-04-12 DIAGNOSIS — K219 Gastro-esophageal reflux disease without esophagitis: Secondary | ICD-10-CM | POA: Diagnosis present

## 2021-04-12 DIAGNOSIS — Z8249 Family history of ischemic heart disease and other diseases of the circulatory system: Secondary | ICD-10-CM

## 2021-04-12 DIAGNOSIS — Z9119 Patient's noncompliance with other medical treatment and regimen: Secondary | ICD-10-CM

## 2021-04-12 DIAGNOSIS — E860 Dehydration: Secondary | ICD-10-CM | POA: Diagnosis present

## 2021-04-12 DIAGNOSIS — G4733 Obstructive sleep apnea (adult) (pediatric): Secondary | ICD-10-CM | POA: Diagnosis present

## 2021-04-12 DIAGNOSIS — L02415 Cutaneous abscess of right lower limb: Secondary | ICD-10-CM | POA: Diagnosis present

## 2021-04-12 DIAGNOSIS — E782 Mixed hyperlipidemia: Secondary | ICD-10-CM | POA: Diagnosis present

## 2021-04-12 DIAGNOSIS — A419 Sepsis, unspecified organism: Secondary | ICD-10-CM

## 2021-04-12 DIAGNOSIS — R6510 Systemic inflammatory response syndrome (SIRS) of non-infectious origin without acute organ dysfunction: Secondary | ICD-10-CM

## 2021-04-12 DIAGNOSIS — B9620 Unspecified Escherichia coli [E. coli] as the cause of diseases classified elsewhere: Secondary | ICD-10-CM | POA: Diagnosis present

## 2021-04-12 DIAGNOSIS — E1110 Type 2 diabetes mellitus with ketoacidosis without coma: Secondary | ICD-10-CM | POA: Diagnosis present

## 2021-04-12 DIAGNOSIS — Z9049 Acquired absence of other specified parts of digestive tract: Secondary | ICD-10-CM

## 2021-04-12 DIAGNOSIS — Z7951 Long term (current) use of inhaled steroids: Secondary | ICD-10-CM

## 2021-04-12 DIAGNOSIS — E876 Hypokalemia: Secondary | ICD-10-CM | POA: Diagnosis present

## 2021-04-12 DIAGNOSIS — T63301A Toxic effect of unspecified spider venom, accidental (unintentional), initial encounter: Secondary | ICD-10-CM | POA: Diagnosis present

## 2021-04-12 DIAGNOSIS — X58XXXA Exposure to other specified factors, initial encounter: Secondary | ICD-10-CM | POA: Diagnosis present

## 2021-04-12 DIAGNOSIS — Z9114 Patient's other noncompliance with medication regimen: Secondary | ICD-10-CM

## 2021-04-12 DIAGNOSIS — Z825 Family history of asthma and other chronic lower respiratory diseases: Secondary | ICD-10-CM

## 2021-04-12 DIAGNOSIS — N179 Acute kidney failure, unspecified: Secondary | ICD-10-CM

## 2021-04-12 LAB — CBG MONITORING, ED: Glucose-Capillary: 507 mg/dL (ref 70–99)

## 2021-04-12 MED ORDER — POTASSIUM CHLORIDE 10 MEQ/100ML IV SOLN
10.0000 meq | INTRAVENOUS | Status: AC
  Administered 2021-04-12 – 2021-04-13 (×2): 10 meq via INTRAVENOUS
  Filled 2021-04-12 (×2): qty 100

## 2021-04-12 MED ORDER — VANCOMYCIN HCL IN DEXTROSE 1-5 GM/200ML-% IV SOLN
1000.0000 mg | Freq: Once | INTRAVENOUS | Status: AC
  Administered 2021-04-12: 1000 mg via INTRAVENOUS
  Filled 2021-04-12: qty 200

## 2021-04-12 MED ORDER — PIPERACILLIN-TAZOBACTAM 3.375 G IVPB 30 MIN
3.3750 g | Freq: Once | INTRAVENOUS | Status: AC
  Administered 2021-04-12: 3.375 g via INTRAVENOUS
  Filled 2021-04-12: qty 50

## 2021-04-12 MED ORDER — DEXTROSE IN LACTATED RINGERS 5 % IV SOLN: INTRAVENOUS | Status: DC

## 2021-04-12 MED ORDER — SODIUM CHLORIDE 0.9 % IV BOLUS
1000.0000 mL | Freq: Once | INTRAVENOUS | Status: AC
  Administered 2021-04-12: 1000 mL via INTRAVENOUS

## 2021-04-12 MED ORDER — DEXTROSE 50 % IV SOLN: 0.0000 mL | INTRAVENOUS | Status: DC | PRN

## 2021-04-12 MED ORDER — INSULIN REGULAR(HUMAN) IN NACL 100-0.9 UT/100ML-% IV SOLN
INTRAVENOUS | Status: DC
  Administered 2021-04-13: 13 [IU]/h via INTRAVENOUS
  Administered 2021-04-13: 2.8 [IU]/h via INTRAVENOUS
  Filled 2021-04-12 (×2): qty 100

## 2021-04-12 MED ORDER — LACTATED RINGERS IV BOLUS: 20.0000 mL/kg | Freq: Once | INTRAVENOUS | Status: DC

## 2021-04-12 MED ORDER — LACTATED RINGERS IV SOLN: INTRAVENOUS | Status: DC

## 2021-04-12 NOTE — ED Notes (Signed)
Unable to obtain MSE signature

## 2021-04-12 NOTE — ED Triage Notes (Signed)
EMS reports that pt is IVC'd, pt with hx HTN and hyperglycemia. CBG 468 and 525.  Reported that pt is confused and has been altered. Possible insect bite to right leg- nothing noted by EMS

## 2021-04-12 NOTE — ED Provider Notes (Signed)
[facility] EMERGENCY DEPARTMENT Provider Note   CSN: [id number] Arrival date & time: 04/12/21  2222     History Chief Complaint  Patient presents with  . Altered Mental Status    Misty Hendrix is a 60 y.o. female.  HPI   This patient is a 60 year old female, she presents by EMS due to multiple complaints, the majority of the information is obtained from the involuntary commitment paperwork that accompanies the patient with the police officer at the bedside and the paramedics report to state that her family member who is with her states that she has not been wanting to care for self, not getting out of bed, refusing to take medications and having progressive altered mental status.  Out of concern that she was unable to care for herself she called the magistrate and was able to get involuntary commitment papers to force her transport to the hospital.  The patient did not have any specific complaints other than his progressive confusion and wanting to drink more fluids.  There is no reported history of trauma, fever, vomiting or diarrhea.  The paramedics report that she was tachycardic in route to the hospital and had a blood sugar that was over 500.  It is unclear when this started but it appears to have been several days ago according to the paramedics report  Review of the medical record shows that the patient has not been admitted to the hospital since 2017  She is supposed to be taking medications according to the medical record which is not up-to-date such as alprazolam, gabapentin, losartan, insulin, Crestor, Januvia.  Past Medical History:  Diagnosis Date  . Anxiety   . Asthma    Dr. Sherene Sires - h/o cough, better off ACE-I  . Back pain, chronic   . Depression   . Diabetes mellitus   . Essential hypertension, benign   . GERD (gastroesophageal reflux disease)   . Lumbar radiculopathy   . Mixed hyperlipidemia   . Obesity   . Obstructive sleep apnea    CPAP-hasn" used since 2015   . Type 2 diabetes mellitus [facility])     Patient Active Problem List   Diagnosis Date Noted  . Chronic cholecystitis with calculus 04/29/2016  . Cervicalgia 09/12/2012  . Pain in limb 09/12/2012  . Preoperative evaluation to rule out surgical contraindication 03/26/2011  . Dyspnea on exertion 03/26/2011  . Morbid obesity (HCC) 03/26/2011  . Type II or unspecified type diabetes mellitus without mention of complication, uncontrolled 03/26/2011  . HYPERLIPIDEMIA 02/18/2010  . Essential hypertension, benign 02/18/2010  . ASTHMA 02/18/2010  . SLEEP APNEA 02/18/2010  . COUGH 02/18/2010    Past Surgical History:  Procedure Laterality Date  . CESAREAN SECTION  1993  . CHOLECYSTECTOMY N/A 04/29/2016   Procedure: LAPAROSCOPIC CHOLECYSTECTOMY;  Surgeon: Rodman Pickle, MD;  Location: [facility];  Service: General;  Laterality: N/A;     OB History   No obstetric history on file.     Family History  Problem Relation Age of Onset  . Coronary artery disease Mother   . Coronary artery disease Father   . Cancer Brother        Throat  . COPD Paternal Aunt   . Asthma Maternal Uncle     Social History   Tobacco Use  . Smoking status: Former Smoker    Packs/day: 0.50    Years: 28.00    Pack years: 14.00    Types: Cigarettes    Quit date:  11/16/2003    Years since quitting: 17.4  . Smokeless tobacco: Current User  . Tobacco comment: e-cigarette w/out nicotine occa.  Substance Use Topics  . Alcohol use: No    Comment: None since 1991  . Drug use: No    Home Medications Prior to Admission medications   Medication Sig Start Date End Date Taking? Authorizing Provider  albuterol (PROVENTIL HFA;VENTOLIN HFA) 108 (90 BASE) MCG/ACT inhaler Inhale 2 puffs into the lungs every 6 (six) hours as needed for wheezing or shortness of breath.    [provider]  ALPRAZolam (XANAX) 0.5 MG tablet TAKE ONE TABLET BY MOUTH ONCE DAILY AT BEDTIME AS NEEDED Patient taking  differently: TAKE ONE TABLET BY MOUTH ONCE DAILY AT BEDTIME AS NEEDED FOR REST. *MAY TAKE DAILY IF NEEDED 12/31/16   Remus Loffler, PA-C  beclomethasone (QVAR) 80 MCG/ACT inhaler Inhale 1 puff into the lungs daily as needed (for shortness of breath). For shortness of breath    [provider]  cycloSPORINE (RESTASIS) 0.05 % ophthalmic emulsion Place 1 drop into both eyes daily as needed (dry eyes).     [provider]  dicyclomine (BENTYL) 20 MG tablet Take 1 tablet (20 mg total) by mouth every 6 (six) hours as needed for spasms (abdominal cramping). 07/09/17   Samuel Jester, DO  fluconazole (DIFLUCAN) 150 MG tablet Take 150 mg by mouth daily as needed. For yeast    [provider]  furosemide (LASIX) 20 MG tablet Take 20-40 mg by mouth daily as needed for fluid.    [provider]  gabapentin (NEURONTIN) 800 MG tablet Take 1 tablet (800 mg total) by mouth 3 (three) times daily. Patient taking differently: Take 800-1,600 mg by mouth 2 (two) times daily. Sometimes takes two capsules once, then one later on for a total of  3 daily 08/28/15   Anson Fret, MD  losartan (COZAAR) 50 MG tablet Take 50 mg by mouth daily.    [provider]  ondansetron (ZOFRAN ODT) 4 MG disintegrating tablet Take 1 tablet (4 mg total) by mouth every 8 (eight) hours as needed for nausea or vomiting. 07/09/17   Samuel Jester, DO  rosuvastatin (CRESTOR) 20 MG tablet Take 20 mg by mouth daily.    [provider]  sitaGLIPtin (JANUVIA) 50 MG tablet Take 50 mg by mouth daily.    [provider]  Tetrahydrozoline HCl (VISINE OP) Apply 1 drop to eye daily as needed (dry eyes).    [provider]    Allergies    Olmesartan, Lisinopril, Benicar [olmesartan medoxomil], and Dextromethorphan-guaifenesin  Review of Systems   Review of Systems  Unable to perform ROS: Mental status change    Physical Exam Updated Vital Signs There were no vitals  taken for this visit.  Physical Exam Vitals and nursing note reviewed.  Constitutional:      General: She is in acute distress.     Appearance: She is well-developed. She is ill-appearing.  HENT:     Head: Normocephalic and atraumatic.     Mouth/Throat:     Mouth: Mucous membranes are dry.     Pharynx: No oropharyngeal exudate.  Eyes:     General: No scleral icterus.       Right eye: No discharge.        Left eye: No discharge.     Conjunctiva/sclera: Conjunctivae normal.     Pupils: Pupils are equal, round, and reactive to light.  Neck:  Thyroid: No thyromegaly.     Vascular: No JVD.  Cardiovascular:     Rate and Rhythm: Regular rhythm. Tachycardia present.     Heart sounds: Normal heart sounds. No murmur heard. No friction rub. No gallop.   Pulmonary:     Breath sounds: Normal breath sounds. No wheezing or rales.     Comments: Lung sounds are clear but the patient is tachypneic Abdominal:     General: Bowel sounds are normal. There is no distension.     Palpations: Abdomen is soft. There is no mass.     Tenderness: There is no abdominal tenderness.  Musculoskeletal:        General: Tenderness present. Normal range of motion.     Cervical back: Normal range of motion and neck supple.     Right lower leg: No edema.     Left lower leg: No edema.     Comments: See note below on skin exam for right proximal medial thigh draining abscess  Lymphadenopathy:     Cervical: No cervical adenopathy.  Skin:    General: Skin is warm and dry.     Findings: Rash present. No erythema.     Comments: Cool mottled skin, there is a very large abscess to the right inner proximal thigh which is draining a purulent material, there is no significant erythema to the skin however the skin does feel indurated in a circular fashion around the central draining area which is draining purulent material  Neurological:     Mental Status: She is alert.     Coordination: Coordination normal.      Comments: Confusion, answers occasional questions, able to move all 4 extremities, no focal weakness, no facial droop, no slurred speech, she does appear mildly agitated     ED Results / Procedures / Treatments   Labs (all labs ordered are listed, but only abnormal results are displayed) Labs Reviewed - No data to display  EKG None  Radiology No results found.  Procedures .Critical Care Performed by: Eber HongMiller, Hanan Moen, MD Authorized by: Eber HongMiller, Liliah Dorian, MD   Critical care provider statement:    Critical care time (minutes):  35   Critical care time was exclusive of:  Separately billable procedures and treating other patients and teaching time   Critical care was necessary to treat or prevent imminent or life-threatening deterioration of the following conditions:  Sepsis and endocrine crisis   Critical care was time spent personally by me on the following activities:  Blood draw for specimens, development of treatment plan with patient or surrogate, discussions with consultants, evaluation of patient's response to treatment, examination of patient, obtaining history from patient or surrogate, ordering and performing treatments and interventions, ordering and review of laboratory studies, ordering and review of radiographic studies, pulse oximetry, re-evaluation of patient's condition and review of old charts     Medications Ordered in ED Medications  lactated ringers bolus 20 mL/kg (has no administration in time range)  insulin regular, human (MYXREDLIN) 100 units/ 100 mL infusion (has no administration in time range)  lactated ringers infusion (has no administration in time range)  dextrose 5 % in lactated ringers infusion (has no administration in time range)  dextrose 50 % solution 0-50 mL (has no administration in time range)  potassium chloride 10 mEq in 100 mL IVPB (has no administration in time range)  sodium chloride 0.9 % bolus 1,000 mL (has no administration in time range)   sodium chloride 0.9 % bolus 1,000 mL (has  no administration in time range)    ED Course  I have reviewed the triage vital signs and the nursing notes.  Pertinent labs & imaging results that were available during my care of the patient were reviewed by me and considered in my medical decision making (see chart for details).    MDM Rules/Calculators/A&P                          In this diabetic was hyperglycemic tachycardic and dry appearing I suspect this is a diabetic complication such as DKA.  She also has some confusion and I would consider that she has hyperosmolar nonketotic crisis.  At this time the patient will need to have further evaluation with labs IV fluids and likely insulin.  We will check potassium level, check urinalysis, check COVID sample, check chest x-ray.  The patient does not appear to be hypotensive, she is not obtunded, she is protecting her airway without difficulty.  Additionally the patient has what appears to be a draining abscess to the right medial thigh which could be the nidus of infection that is causing the hyperglycemia and the decompensation.  Code sepsis activated, antibiotics ordered, cultures will be ordered  Critically ill  At change of shift care was signed out to Dr. Manus Gunning following up results and disposition.  This patient will need to be admitted.  I have overturned the involuntary commitment.  The patient has multiple abnormal vital signs as well as laboratory values that suggest the need for admission for treatment of this infection and the resulting hyperglycemia.  Final Clinical Impression(s) / ED Diagnoses Final diagnoses:  None    Rx / DC Orders ED Discharge Orders    None       Eber Hong, MD 04/13/21 [phone]

## 2021-04-12 NOTE — Sepsis Progress Note (Signed)
Monitoring for code sepsis protocol. 

## 2021-04-12 NOTE — ED Notes (Addendum)
EDP made aware of CBG 507 EDP made aware of large abscess to right upper inner thigh

## 2021-04-13 ENCOUNTER — Other Ambulatory Visit: Payer: Self-pay

## 2021-04-13 DIAGNOSIS — R6510 Systemic inflammatory response syndrome (SIRS) of non-infectious origin without acute organ dysfunction: Secondary | ICD-10-CM

## 2021-04-13 DIAGNOSIS — E1110 Type 2 diabetes mellitus with ketoacidosis without coma: Secondary | ICD-10-CM | POA: Diagnosis present

## 2021-04-13 DIAGNOSIS — G9341 Metabolic encephalopathy: Secondary | ICD-10-CM | POA: Diagnosis present

## 2021-04-13 DIAGNOSIS — L03115 Cellulitis of right lower limb: Secondary | ICD-10-CM | POA: Diagnosis present

## 2021-04-13 DIAGNOSIS — N179 Acute kidney failure, unspecified: Secondary | ICD-10-CM

## 2021-04-13 DIAGNOSIS — N390 Urinary tract infection, site not specified: Secondary | ICD-10-CM | POA: Diagnosis present

## 2021-04-13 DIAGNOSIS — L02415 Cutaneous abscess of right lower limb: Secondary | ICD-10-CM | POA: Diagnosis present

## 2021-04-13 DIAGNOSIS — A419 Sepsis, unspecified organism: Secondary | ICD-10-CM

## 2021-04-13 DIAGNOSIS — G4733 Obstructive sleep apnea (adult) (pediatric): Secondary | ICD-10-CM | POA: Diagnosis present

## 2021-04-13 DIAGNOSIS — L0291 Cutaneous abscess, unspecified: Secondary | ICD-10-CM | POA: Diagnosis present

## 2021-04-13 DIAGNOSIS — E441 Mild protein-calorie malnutrition: Secondary | ICD-10-CM

## 2021-04-13 DIAGNOSIS — R7989 Other specified abnormal findings of blood chemistry: Secondary | ICD-10-CM | POA: Diagnosis present

## 2021-04-13 DIAGNOSIS — E0810 Diabetes mellitus due to underlying condition with ketoacidosis without coma: Secondary | ICD-10-CM

## 2021-04-13 DIAGNOSIS — E876 Hypokalemia: Secondary | ICD-10-CM | POA: Diagnosis present

## 2021-04-13 DIAGNOSIS — I10 Essential (primary) hypertension: Secondary | ICD-10-CM | POA: Diagnosis present

## 2021-04-13 DIAGNOSIS — K219 Gastro-esophageal reflux disease without esophagitis: Secondary | ICD-10-CM | POA: Diagnosis present

## 2021-04-13 DIAGNOSIS — Z6835 Body mass index (BMI) 35.0-35.9, adult: Secondary | ICD-10-CM | POA: Diagnosis not present

## 2021-04-13 DIAGNOSIS — Z20822 Contact with and (suspected) exposure to covid-19: Secondary | ICD-10-CM | POA: Diagnosis present

## 2021-04-13 DIAGNOSIS — Z9119 Patient's noncompliance with other medical treatment and regimen: Secondary | ICD-10-CM | POA: Diagnosis not present

## 2021-04-13 DIAGNOSIS — Z9049 Acquired absence of other specified parts of digestive tract: Secondary | ICD-10-CM | POA: Diagnosis not present

## 2021-04-13 DIAGNOSIS — Z9114 Patient's other noncompliance with medication regimen: Secondary | ICD-10-CM | POA: Diagnosis not present

## 2021-04-13 DIAGNOSIS — R6520 Severe sepsis without septic shock: Secondary | ICD-10-CM | POA: Diagnosis present

## 2021-04-13 DIAGNOSIS — A4159 Other Gram-negative sepsis: Secondary | ICD-10-CM | POA: Diagnosis not present

## 2021-04-13 DIAGNOSIS — X58XXXA Exposure to other specified factors, initial encounter: Secondary | ICD-10-CM | POA: Diagnosis present

## 2021-04-13 DIAGNOSIS — E860 Dehydration: Secondary | ICD-10-CM | POA: Diagnosis present

## 2021-04-13 DIAGNOSIS — E669 Obesity, unspecified: Secondary | ICD-10-CM | POA: Diagnosis present

## 2021-04-13 DIAGNOSIS — E782 Mixed hyperlipidemia: Secondary | ICD-10-CM | POA: Diagnosis present

## 2021-04-13 DIAGNOSIS — B9620 Unspecified Escherichia coli [E. coli] as the cause of diseases classified elsewhere: Secondary | ICD-10-CM | POA: Diagnosis present

## 2021-04-13 DIAGNOSIS — Z888 Allergy status to other drugs, medicaments and biological substances status: Secondary | ICD-10-CM | POA: Diagnosis not present

## 2021-04-13 LAB — CBC WITH DIFFERENTIAL/PLATELET
Abs Immature Granulocytes: 0.13 10*3/uL — ABNORMAL HIGH (ref 0.00–0.07)
Abs Immature Granulocytes: 0.2 10*3/uL — ABNORMAL HIGH (ref 0.00–0.07)
Basophils Absolute: 0.1 10*3/uL (ref 0.0–0.1)
Basophils Absolute: 0.1 10*3/uL (ref 0.0–0.1)
Basophils Relative: 1 %
Basophils Relative: 1 %
Eosinophils Absolute: 0 10*3/uL (ref 0.0–0.5)
Eosinophils Absolute: 0 10*3/uL (ref 0.0–0.5)
Eosinophils Relative: 0 %
Eosinophils Relative: 0 %
HCT: 35 % — ABNORMAL LOW (ref 36.0–46.0)
HCT: 43.7 % (ref 36.0–46.0)
Hemoglobin: 11.9 g/dL — ABNORMAL LOW (ref 12.0–15.0)
Hemoglobin: 14.4 g/dL (ref 12.0–15.0)
Immature Granulocytes: 1 %
Immature Granulocytes: 2 %
Lymphocytes Relative: 20 %
Lymphocytes Relative: 25 %
Lymphs Abs: 2.2 10*3/uL (ref 0.7–4.0)
Lymphs Abs: 2.9 10*3/uL (ref 0.7–4.0)
MCH: 31.7 pg (ref 26.0–34.0)
MCH: 31.8 pg (ref 26.0–34.0)
MCHC: 33 g/dL (ref 30.0–36.0)
MCHC: 34 g/dL (ref 30.0–36.0)
MCV: 93.6 fL (ref 80.0–100.0)
MCV: 96.3 fL (ref 80.0–100.0)
Monocytes Absolute: 1.2 10*3/uL — ABNORMAL HIGH (ref 0.1–1.0)
Monocytes Absolute: 1.9 10*3/uL — ABNORMAL HIGH (ref 0.1–1.0)
Monocytes Relative: 11 %
Monocytes Relative: 16 %
Neutro Abs: 6.8 10*3/uL (ref 1.7–7.7)
Neutro Abs: 7.3 10*3/uL (ref 1.7–7.7)
Neutrophils Relative %: 57 %
Neutrophils Relative %: 66 %
Platelets: 421 10*3/uL — ABNORMAL HIGH (ref 150–400)
Platelets: 471 10*3/uL — ABNORMAL HIGH (ref 150–400)
RBC: 3.74 MIL/uL — ABNORMAL LOW (ref 3.87–5.11)
RBC: 4.54 MIL/uL (ref 3.87–5.11)
RDW: 13.4 % (ref 11.5–15.5)
RDW: 13.4 % (ref 11.5–15.5)
WBC: 11.1 10*3/uL — ABNORMAL HIGH (ref 4.0–10.5)
WBC: 11.9 10*3/uL — ABNORMAL HIGH (ref 4.0–10.5)
nRBC: 0 % (ref 0.0–0.2)
nRBC: 0 % (ref 0.0–0.2)

## 2021-04-13 LAB — BASIC METABOLIC PANEL
Anion gap: 11 (ref 5–15)
Anion gap: 6 (ref 5–15)
BUN: 22 mg/dL (ref 8–23)
BUN: 23 mg/dL (ref 8–23)
BUN: 30 mg/dL — ABNORMAL HIGH (ref 8–23)
CO2: 14 mmol/L — ABNORMAL LOW (ref 22–32)
CO2: 19 mmol/L — ABNORMAL LOW (ref 22–32)
CO2: <7 mmol/L — ABNORMAL LOW (ref 22–32)
Calcium: 8.7 mg/dL — ABNORMAL LOW (ref 8.9–10.3)
Calcium: 8.9 mg/dL (ref 8.9–10.3)
Calcium: 8.9 mg/dL (ref 8.9–10.3)
Chloride: 103 mmol/L (ref 98–111)
Chloride: 110 mmol/L (ref 98–111)
Chloride: 111 mmol/L (ref 98–111)
Creatinine, Ser: 0.73 mg/dL (ref 0.44–1.00)
Creatinine, Ser: 0.76 mg/dL (ref 0.44–1.00)
Creatinine, Ser: 1.27 mg/dL — ABNORMAL HIGH (ref 0.44–1.00)
GFR, Estimated: 48 mL/min — ABNORMAL LOW (ref >60)
GFR, Estimated: >60 mL/min (ref >60)
GFR, Estimated: >60 mL/min (ref >60)
Glucose, Bld: 177 mg/dL — ABNORMAL HIGH (ref 70–99)
Glucose, Bld: 181 mg/dL — ABNORMAL HIGH (ref 70–99)
Glucose, Bld: 538 mg/dL (ref 70–99)
Potassium: 3.1 mmol/L — ABNORMAL LOW (ref 3.5–5.1)
Potassium: 3.5 mmol/L (ref 3.5–5.1)
Potassium: 4.5 mmol/L (ref 3.5–5.1)
Sodium: 130 mmol/L — ABNORMAL LOW (ref 135–145)
Sodium: 135 mmol/L (ref 135–145)
Sodium: 136 mmol/L (ref 135–145)

## 2021-04-13 LAB — BLOOD GAS, ARTERIAL
Acid-base deficit: 7.6 mmol/L — ABNORMAL HIGH (ref 0.0–2.0)
Bicarbonate: 18.7 mmol/L — ABNORMAL LOW (ref 20.0–28.0)
FIO2: 21
FIO2: 21
O2 Saturation: 98.5 %
O2 Saturation: 97.8 %
Patient temperature: 37
Patient temperature: 36.5
pCO2 arterial: <16 mmHg — CL (ref 32.0–48.0)
pCO2 arterial: 28.5 mmHg — ABNORMAL LOW (ref 32.0–48.0)
pH, Arterial: 7.167 — CL (ref 7.350–7.450)
pH, Arterial: 7.381 (ref 7.350–7.450)
pO2, Arterial: 133 mmHg — ABNORMAL HIGH (ref 83.0–108.0)
pO2, Arterial: 87.4 mmHg (ref 83.0–108.0)

## 2021-04-13 LAB — GLUCOSE, CAPILLARY
Glucose-Capillary: 214 mg/dL — ABNORMAL HIGH (ref 70–99)
Glucose-Capillary: 250 mg/dL — ABNORMAL HIGH (ref 70–99)
Glucose-Capillary: 183 mg/dL — ABNORMAL HIGH (ref 70–99)
Glucose-Capillary: 149 mg/dL — ABNORMAL HIGH (ref 70–99)
Glucose-Capillary: 190 mg/dL — ABNORMAL HIGH (ref 70–99)
Glucose-Capillary: 164 mg/dL — ABNORMAL HIGH (ref 70–99)
Glucose-Capillary: 176 mg/dL — ABNORMAL HIGH (ref 70–99)
Glucose-Capillary: 184 mg/dL — ABNORMAL HIGH (ref 70–99)
Glucose-Capillary: 168 mg/dL — ABNORMAL HIGH (ref 70–99)
Glucose-Capillary: 161 mg/dL — ABNORMAL HIGH (ref 70–99)
Glucose-Capillary: 175 mg/dL — ABNORMAL HIGH (ref 70–99)
Glucose-Capillary: 130 mg/dL — ABNORMAL HIGH (ref 70–99)
Glucose-Capillary: 232 mg/dL — ABNORMAL HIGH (ref 70–99)

## 2021-04-13 LAB — RESP PANEL BY RT-PCR (FLU A&B, COVID) ARPGX2
Influenza A by PCR: NEGATIVE
Influenza B by PCR: NEGATIVE
SARS Coronavirus 2 by RT PCR: NEGATIVE

## 2021-04-13 LAB — COMPREHENSIVE METABOLIC PANEL
ALT: 16 U/L (ref 0–44)
AST: 12 U/L — ABNORMAL LOW (ref 15–41)
Albumin: 2.6 g/dL — ABNORMAL LOW (ref 3.5–5.0)
Alkaline Phosphatase: 142 U/L — ABNORMAL HIGH (ref 38–126)
Anion gap: 11 (ref 5–15)
BUN: 25 mg/dL — ABNORMAL HIGH (ref 8–23)
CO2: 13 mmol/L — ABNORMAL LOW (ref 22–32)
Calcium: 8.7 mg/dL — ABNORMAL LOW (ref 8.9–10.3)
Chloride: 110 mmol/L (ref 98–111)
Creatinine, Ser: 0.85 mg/dL (ref 0.44–1.00)
GFR, Estimated: >60 mL/min (ref >60)
Glucose, Bld: 185 mg/dL — ABNORMAL HIGH (ref 70–99)
Potassium: 3.3 mmol/L — ABNORMAL LOW (ref 3.5–5.1)
Sodium: 134 mmol/L — ABNORMAL LOW (ref 135–145)
Total Bilirubin: 1.1 mg/dL (ref 0.3–1.2)
Total Protein: 6.3 g/dL — ABNORMAL LOW (ref 6.5–8.1)

## 2021-04-13 LAB — BETA-HYDROXYBUTYRIC ACID
Beta-Hydroxybutyric Acid: 7.62 mmol/L — ABNORMAL HIGH (ref 0.05–0.27)
Beta-Hydroxybutyric Acid: 2.8 mmol/L — ABNORMAL HIGH (ref 0.05–0.27)
Beta-Hydroxybutyric Acid: 0.78 mmol/L — ABNORMAL HIGH (ref 0.05–0.27)

## 2021-04-13 LAB — CBG MONITORING, ED
Glucose-Capillary: 475 mg/dL — ABNORMAL HIGH (ref 70–99)
Glucose-Capillary: 569 mg/dL (ref 70–99)
Glucose-Capillary: 258 mg/dL — ABNORMAL HIGH (ref 70–99)
Glucose-Capillary: 342 mg/dL — ABNORMAL HIGH (ref 70–99)
Glucose-Capillary: 186 mg/dL — ABNORMAL HIGH (ref 70–99)
Glucose-Capillary: 189 mg/dL — ABNORMAL HIGH (ref 70–99)
Glucose-Capillary: 169 mg/dL — ABNORMAL HIGH (ref 70–99)
Glucose-Capillary: 224 mg/dL — ABNORMAL HIGH (ref 70–99)

## 2021-04-13 LAB — URINALYSIS, ROUTINE W REFLEX MICROSCOPIC
Bilirubin Urine: NEGATIVE
Glucose, UA: >=500 mg/dL — AB
Ketones, ur: 80 mg/dL — AB
Nitrite: NEGATIVE
Protein, ur: 100 mg/dL — AB
RBC / HPF: >50 RBC/hpf — ABNORMAL HIGH (ref 0–5)
Specific Gravity, Urine: 1.021 (ref 1.005–1.030)
pH: 5 (ref 5.0–8.0)

## 2021-04-13 LAB — HEPATIC FUNCTION PANEL
ALT: 20 U/L (ref 0–44)
AST: 13 U/L — ABNORMAL LOW (ref 15–41)
Albumin: 3.2 g/dL — ABNORMAL LOW (ref 3.5–5.0)
Alkaline Phosphatase: 182 U/L — ABNORMAL HIGH (ref 38–126)
Bilirubin, Direct: 0.1 mg/dL (ref 0.0–0.2)
Indirect Bilirubin: 2.1 mg/dL — ABNORMAL HIGH (ref 0.3–0.9)
Total Bilirubin: 2.2 mg/dL — ABNORMAL HIGH (ref 0.3–1.2)
Total Protein: 7.8 g/dL (ref 6.5–8.1)

## 2021-04-13 LAB — CORTISOL-AM, BLOOD: Cortisol - AM: 27.7 ug/dL — ABNORMAL HIGH (ref 6.7–22.6)

## 2021-04-13 LAB — PROTIME-INR
INR: 1.3 — ABNORMAL HIGH (ref 0.8–1.2)
INR: 1.2 (ref 0.8–1.2)
Prothrombin Time: 16.3 seconds — ABNORMAL HIGH (ref 11.4–15.2)
Prothrombin Time: 15.4 seconds — ABNORMAL HIGH (ref 11.4–15.2)

## 2021-04-13 LAB — BLOOD GAS, VENOUS
Acid-base deficit: 13.2 mmol/L — ABNORMAL HIGH (ref 0.0–2.0)
Bicarbonate: 14.2 mmol/L — ABNORMAL LOW (ref 20.0–28.0)
FIO2: 21
O2 Saturation: 85 %
Patient temperature: 36.5
pCO2, Ven: 28.7 mmHg — ABNORMAL LOW (ref 44.0–60.0)
pH, Ven: 7.261 (ref 7.250–7.430)
pO2, Ven: 49.6 mmHg — ABNORMAL HIGH (ref 32.0–45.0)

## 2021-04-13 LAB — PROCALCITONIN: Procalcitonin: 0.32 ng/mL

## 2021-04-13 LAB — MAGNESIUM: Magnesium: 1.7 mg/dL (ref 1.7–2.4)

## 2021-04-13 LAB — HIV ANTIBODY (ROUTINE TESTING W REFLEX): HIV Screen 4th Generation wRfx: NONREACTIVE

## 2021-04-13 LAB — APTT: aPTT: 22 seconds — ABNORMAL LOW (ref 24–36)

## 2021-04-13 LAB — LACTIC ACID, PLASMA
Lactic Acid, Venous: 1.4 mmol/L (ref 0.5–1.9)
Lactic Acid, Venous: 1.4 mmol/L (ref 0.5–1.9)

## 2021-04-13 LAB — MRSA PCR SCREENING: MRSA by PCR: NEGATIVE

## 2021-04-13 MED ORDER — OXYCODONE HCL 5 MG PO TABS
5.0000 mg | ORAL_TABLET | ORAL | Status: DC | PRN
  Administered 2021-04-15: 5 mg via ORAL
  Filled 2021-04-13: qty 1

## 2021-04-13 MED ORDER — ONDANSETRON HCL 4 MG/2ML IJ SOLN: 4.0000 mg | Freq: Four times a day (QID) | INTRAMUSCULAR | Status: DC | PRN

## 2021-04-13 MED ORDER — VANCOMYCIN HCL IN DEXTROSE 1-5 GM/200ML-% IV SOLN
1000.0000 mg | Freq: Once | INTRAVENOUS | Status: AC
  Administered 2021-04-13: 1000 mg via INTRAVENOUS
  Filled 2021-04-13: qty 200

## 2021-04-13 MED ORDER — ONDANSETRON HCL 4 MG PO TABS: 4.0000 mg | ORAL_TABLET | Freq: Four times a day (QID) | ORAL | Status: DC | PRN

## 2021-04-13 MED ORDER — ROSUVASTATIN CALCIUM 20 MG PO TABS
20.0000 mg | ORAL_TABLET | Freq: Every day | ORAL | Status: DC
  Administered 2021-04-13 – 2021-04-16 (×4): 20 mg via ORAL
  Filled 2021-04-13 (×4): qty 1

## 2021-04-13 MED ORDER — LACTATED RINGERS IV BOLUS
1000.0000 mL | Freq: Once | INTRAVENOUS | Status: AC
  Administered 2021-04-13: 1000 mL via INTRAVENOUS

## 2021-04-13 MED ORDER — CHLORHEXIDINE GLUCONATE CLOTH 2 % EX PADS
6.0000 | MEDICATED_PAD | Freq: Every day | CUTANEOUS | Status: DC
  Administered 2021-04-13 – 2021-04-16 (×4): 6 via TOPICAL

## 2021-04-13 MED ORDER — HEPARIN SODIUM (PORCINE) 5000 UNIT/ML IJ SOLN
5000.0000 [IU] | Freq: Three times a day (TID) | INTRAMUSCULAR | Status: DC
  Administered 2021-04-13 – 2021-04-16 (×10): 5000 [IU] via SUBCUTANEOUS
  Filled 2021-04-13 (×10): qty 1

## 2021-04-13 MED ORDER — ACETAMINOPHEN 325 MG PO TABS: 650.0000 mg | ORAL_TABLET | Freq: Four times a day (QID) | ORAL | Status: DC | PRN

## 2021-04-13 MED ORDER — ALPRAZOLAM 0.25 MG PO TABS: 0.2500 mg | ORAL_TABLET | Freq: Every evening | ORAL | Status: DC | PRN

## 2021-04-13 MED ORDER — VANCOMYCIN HCL 1500 MG/300ML IV SOLN
1500.0000 mg | INTRAVENOUS | Status: DC
  Administered 2021-04-14 – 2021-04-15 (×2): 1500 mg via INTRAVENOUS
  Filled 2021-04-13 (×2): qty 300

## 2021-04-13 MED ORDER — ALBUTEROL SULFATE HFA 108 (90 BASE) MCG/ACT IN AERS: 2.0000 | INHALATION_SPRAY | Freq: Four times a day (QID) | RESPIRATORY_TRACT | Status: DC | PRN

## 2021-04-13 MED ORDER — ACETAMINOPHEN 650 MG RE SUPP: 650.0000 mg | Freq: Four times a day (QID) | RECTAL | Status: DC | PRN

## 2021-04-13 MED ORDER — GABAPENTIN 400 MG PO CAPS
800.0000 mg | ORAL_CAPSULE | Freq: Three times a day (TID) | ORAL | Status: DC
  Administered 2021-04-13 – 2021-04-16 (×9): 800 mg via ORAL
  Filled 2021-04-13 (×9): qty 2

## 2021-04-13 MED ORDER — LIDOCAINE HCL (PF) 1 % IJ SOLN
INTRAMUSCULAR | Status: AC
  Filled 2021-04-13: qty 5

## 2021-04-13 NOTE — Progress Notes (Signed)
Pharmacy Antibiotic Note  AHLEAH SIMKO is a 60 y.o. female admitted on 04/12/2021 with cellulitis.  Pharmacy has been consulted for vancomycin dosing.Vancomycin 1gm given in ED at 23:24.  Plan: Give additional 1gm dose now for total load of 2 gram then 1500 mg IV q24 hours F/u renal function, cultures and clinical course     Temp (24hrs), Avg:97.7 F (36.5 C), Min:97.7 F (36.5 C), Max:97.7 F (36.5 C)  Recent Labs  Lab 04/12/21 2320 04/12/21 2330  WBC  --  11.1*  CREATININE  --  1.27*  LATICACIDVEN 1.4  --     CrCl cannot be calculated (Unknown ideal weight.).    Allergies  Allergen Reactions  . Olmesartan Rash  . Lisinopril Cough  . Benicar [Olmesartan Medoxomil] Rash  . Dextromethorphan-Guaifenesin Nausea And Vomiting    Thank you for allowing pharmacy to be a part of this patient's care.  Talbert Cage [place]et 04/13/2021 2:00 AM

## 2021-04-13 NOTE — Progress Notes (Signed)
[facility]  Patient needing I&D of right thigh abscess, is draining some but needs to be opened more. Is confused but oriented to self and [employer] Hawking and ?spider bite. Tried her brother's numbers but no answer and incorrect numbers.  Deleted incorrect numbers. Home number for patient also incorrect and was deleted.   Called Samson Frederic, cousin @ [phone], who answered and got Aleaya Latona, daughter's number, [phone] and put this in the chart but she did not answer.    Ms. Myna Bright has graciously went to Christina's house to get in touch with her as we will need permission for a bedside I&D.  Algis Greenhouse, MD [facility] [address] Vella Raring [place], [place] [address] [phone] (office)

## 2021-04-13 NOTE — Progress Notes (Signed)
Patient is very lethargic and difficult to awaken. Only awakens to painful stimuli and is very quick to fall back asleep. All vital signs WNL.  Dr. Gwenlyn Perking notified and order for an ABG obtained.

## 2021-04-13 NOTE — Sepsis Progress Note (Signed)
Verified with bedside RN via secure chat that zosyn was given before blood cultures were drawn.

## 2021-04-13 NOTE — Progress Notes (Signed)
Patient seen and examined. Admitted after midnight secondary to sepsis due to right thigh cellulitis/abscess and DKA. Receiving Insulin drip currently, and still acidotic with bicarb of 14. General surgery consulted to further assist with I & D; will continue current antibiotics per cellulitis order set. Please refer to H&P written by Dr. Carren Rang for further info/details on admission.  Plan: -continue treatment for DKA (endotool protocol) -continue current IV's -follow general surgery recommendations regarding I &D  Vassie Loll MD [phone]

## 2021-04-13 NOTE — H&P (Signed)
TRH H&P    Patient Demographics:    Misty Hendrix, is a 60 y.o. female  MRN: [id number]  DOB - 1960-12-27  Admit Date - 04/12/2021  Referring MD/NP/PA: Rancour  Outpatient Primary MD for the patient is Hart Robinsons, DO  Patient coming from: Home  Chief complaint- fatigue   HPI:    Misty Hendrix  is a 60 y.o. female, with history of type 2 diabetes mellitus, obstructive sleep apnea, mixed hyperlipidemia, GERD, essential hypertension, and more presents the ED with a chief complaint of fatigue.  Unfortunately patient herself is not able to provide any history.  She is oriented to person place and time, but then on further questioning just continues to repeat the year, not answering other questions.  When she finally is able to stop perseverating on the year, she reports she does not know why she is here and her daughter told her she had to come.  Apparently when she first came in with EMS she was IVC'ed, but that paperwork was rescinded as patient was not thought to be a risk of harm to self.  ED provider reports that family had complained of progressive fatigue, and that family was concerned patient was not taking care of herself properly at home.  Patient is not able to tell me her medications or what her blood sugars have been running so it is quite possible that she is not able to take care of herself at home.  Patient reports that she is in no pain at the time of my exam.  She reports that somebody told her her right leg hurts, but she does not feel a pain now.  In the ED Temp 97.7, heart rate 115-122, respiratory rate 17-26, blood pressure 132/53, satting 100% No leukocytosis with a white blood cell count of 11.1, hemoglobin 14.4 Chemistry panel reveals a pseudohyponatremia 130, corrects for glucose 538 Bicarb is less than 7 Creatinine is 1.27 up from baseline 0.8 Glucose is 538 pH is 7.1 Lactic acid is  normal Respiratory panel is negative Blood cultures pending Chest x-ray shows no active disease  Vancomycin and Zosyn started in the ED 2 L bolus given in the ED Admission requested for further management of DKA cellulitis/abscess     Review of systems:    Cannot obtain review of systems secondary to patient's dementia    Past History of the following :    Past Medical History:  Diagnosis Date  . Anxiety   . Asthma    Dr. Sherene Sires - h/o cough, better off ACE-I  . Back pain, chronic   . Depression   . Diabetes mellitus   . Essential hypertension, benign   . GERD (gastroesophageal reflux disease)   . Lumbar radiculopathy   . Mixed hyperlipidemia   . Obesity   . Obstructive sleep apnea    CPAP-hasn" used since 2015  . Type 2 diabetes mellitus (HCC)       Past Surgical History:  Procedure Laterality Date  . CESAREAN SECTION  1993  . CHOLECYSTECTOMY N/A 04/29/2016  Procedure: LAPAROSCOPIC CHOLECYSTECTOMY;  Surgeon: Rodman Pickle, MD;  Location: [facility];  Service: General;  Laterality: N/A;      Social History:      Social History   Tobacco Use  . Smoking status: Former Smoker    Packs/day: 0.50    Years: 28.00    Pack years: 14.00    Types: Cigarettes    Quit date: 11/16/2003    Years since quitting: 17.4  . Smokeless tobacco: Current User  . Tobacco comment: e-cigarette w/out nicotine occa.  Substance Use Topics  . Alcohol use: No    Comment: None since 68       Family History :     Family History  Problem Relation Age of Onset  . Coronary artery disease Mother   . Coronary artery disease Father   . Cancer Brother        Throat  . COPD Paternal Aunt   . Asthma Maternal Uncle       Home Medications:   Prior to Admission medications   Medication Sig Start Date End Date Taking? Authorizing Provider  albuterol (PROVENTIL HFA;VENTOLIN HFA) 108 (90 BASE) MCG/ACT inhaler Inhale 2 puffs into the lungs every 6 (six) hours as  needed for wheezing or shortness of breath.    [provider]  ALPRAZolam (XANAX) 0.5 MG tablet TAKE ONE TABLET BY MOUTH ONCE DAILY AT BEDTIME AS NEEDED Patient taking differently: TAKE ONE TABLET BY MOUTH ONCE DAILY AT BEDTIME AS NEEDED FOR REST. *MAY TAKE DAILY IF NEEDED 12/31/16   Remus Loffler, PA-C  beclomethasone (QVAR) 80 MCG/ACT inhaler Inhale 1 puff into the lungs daily as needed (for shortness of breath). For shortness of breath    [provider]  cycloSPORINE (RESTASIS) 0.05 % ophthalmic emulsion Place 1 drop into both eyes daily as needed (dry eyes).     [provider]  dicyclomine (BENTYL) 20 MG tablet Take 1 tablet (20 mg total) by mouth every 6 (six) hours as needed for spasms (abdominal cramping). 07/09/17   Samuel Jester, DO  fluconazole (DIFLUCAN) 150 MG tablet Take 150 mg by mouth daily as needed. For yeast    [provider]  furosemide (LASIX) 20 MG tablet Take 20-40 mg by mouth daily as needed for fluid.    [provider]  gabapentin (NEURONTIN) 800 MG tablet Take 1 tablet (800 mg total) by mouth 3 (three) times daily. Patient taking differently: Take 800-1,600 mg by mouth 2 (two) times daily. Sometimes takes two capsules once, then one later on for a total of  3 daily 08/28/15   Anson Fret, MD  losartan (COZAAR) 50 MG tablet Take 50 mg by mouth daily.    [provider]  ondansetron (ZOFRAN ODT) 4 MG disintegrating tablet Take 1 tablet (4 mg total) by mouth every 8 (eight) hours as needed for nausea or vomiting. 07/09/17   Samuel Jester, DO  rosuvastatin (CRESTOR) 20 MG tablet Take 20 mg by mouth daily.    [provider]  sitaGLIPtin (JANUVIA) 50 MG tablet Take 50 mg by mouth daily.    [provider]  Tetrahydrozoline HCl (VISINE OP) Apply 1 drop to eye daily as needed (dry eyes).    [provider]     Allergies:     Allergies  Allergen Reactions  . Olmesartan Rash  .  Lisinopril Cough  . Benicar [Olmesartan Medoxomil] Rash  . Dextromethorphan-Guaifenesin Nausea And Vomiting     Physical Exam:  Vitals  Blood pressure 138/70, pulse (!) 111, temperature 97.7 F (36.5 C), temperature source Oral, resp. rate 19, SpO2 100 %.  1.  General: Patient lying supine in bed in no acute distress  2. Psychiatric: Mood and behavior normal for situation, alert and oriented x3, perseverates on the year-to the point where it limits the rest of the medical interview, pleasant and cooperative with exam  3. Neurologic: Speech and language are normal, face is symmetric, moves all 4 extremities voluntarily  4. HEENMT:  Head is atraumatic, normocephalic, pupils reactive to light, neck is supple, trachea is midline, mucous membranes are moist  5. Respiratory : Lungs with diffuse wheezing, no rales or rhonchi, no clubbing, no cyanosis  6. Cardiovascular : Heart rate is normal, rhythm is regular, no murmurs rubs or gallops,  no peripheral   7. Gastrointestinal:  Abdomen is non distended, nontender to palpation, no palpable masses, bowel sounds active  8. Skin:  Draining abscess with surrounding cellulitis and induration on the right upper medial thigh, no other acute lesions on limited exam  9.Musculoskeletal:  No peripheral edema, no acute deformities, no calf tenderness, peripheral pulses palpated   Data Review:    CBC Recent Labs  Lab 04/12/21 2330  WBC 11.1*  HGB 14.4  HCT 43.7  PLT 471*  MCV 96.3  MCH 31.7  MCHC 33.0  RDW 13.4  LYMPHSABS 2.2  MONOABS 1.2*  EOSABS 0.0  BASOSABS 0.1   ------------------------------------------------------------------------------------------------------------------  Results for orders placed or performed during the hospital encounter of 04/12/21 (from the past 48 hour(s))  CBG monitoring, ED     Status: Abnormal   Collection Time: 04/12/21 10:31 PM  Result Value Ref Range   Glucose-Capillary 507 (HH) 70 - 99  mg/dL    Comment: Glucose reference range applies only to samples taken after fasting for at least 8 hours.   Comment 1 Notify RN    Comment 2 Document in Chart   Beta-hydroxybutyric acid     Status: Abnormal   Collection Time: 04/12/21 11:20 PM  Result Value Ref Range   Beta-Hydroxybutyric Acid 7.62 (H) 0.05 - 0.27 mmol/L    Comment: Performed at [facility], [address], [place], [place] [address]  Lactic acid, plasma     Status: None   Collection Time: 04/12/21 11:20 PM  Result Value Ref Range   Lactic Acid, Venous 1.4 0.5 - 1.9 mmol/L    Comment: Performed at [facility], [address], [place], [place] [address]  Protime-INR     Status: Abnormal   Collection Time: 04/12/21 11:20 PM  Result Value Ref Range   Prothrombin Time 16.3 (H) 11.4 - 15.2 seconds   INR 1.3 (H) 0.8 - 1.2    Comment: (NOTE) INR goal varies based on device and disease states. Performed at [facility], [address], [place], [place] [address]   APTT     Status: Abnormal   Collection Time: 04/12/21 11:20 PM  Result Value Ref Range   aPTT 22 (L) 24 - 36 seconds    Comment: Performed at [facility], [address] [place], [place] [address]  Basic metabolic panel     Status: Abnormal   Collection Time: 04/12/21 11:30 PM  Result Value Ref Range   Sodium 130 (L) 135 - 145 mmol/L   Potassium 4.5 3.5 - 5.1 mmol/L   Chloride 103 98 - 111 mmol/L   CO2 <7 (L) 22 - 32 mmol/L   Glucose, Bld 538 (HH) 70 -  99 mg/dL    Comment: Glucose reference range applies only to samples taken after fasting for at least 8 hours. CRITICAL RESULT CALLED TO, READ BACK BY AND VERIFIED WITH: Lockie ParesWALKER,T 0041 04/13/2021 COLEMAN,R    BUN 30 (H) 8 - 23 mg/dL   Creatinine, Ser 1.611.27 (H) 0.44 - 1.00 mg/dL   Calcium 8.7 (L) 8.9 - 10.3 mg/dL   GFR, Estimated 48 (L) >60 mL/min    Comment: (NOTE) Calculated using the CKD-EPI Creatinine Equation (2021)    Anion gap ELECTROLYTES REPEATED TO CONFIRM 5 - 15    Comment: Performed at [facility], [address] [place], [place] [address]  CBC with Differential (PNL)     Status: Abnormal   Collection Time: 04/12/21 11:30 PM  Result Value Ref Range   WBC 11.1 (H) 4.0 - 10.5 K/uL   RBC 4.54 3.87 - 5.11 MIL/uL   Hemoglobin 14.4 12.0 - 15.0 g/dL   HCT 54.043.7 98.136.0 - 19.146.0 %   MCV 96.3 80.0 - 100.0 fL   MCH 31.7 26.0 - 34.0 pg   MCHC 33.0 30.0 - 36.0 g/dL   RDW 47.813.4 29.511.5 - 62.115.5 %   Platelets 471 (H) 150 - 400 K/uL   nRBC 0.0 0.0 - 0.2 %   Neutrophils Relative % 66 %   Neutro Abs 7.3 1.7 - 7.7 K/uL   Lymphocytes Relative 20 %   Lymphs Abs 2.2 0.7 - 4.0 K/uL   Monocytes Relative 11 %   Monocytes Absolute 1.2 (H) 0.1 - 1.0 K/uL   Eosinophils Relative 0 %   Eosinophils Absolute 0.0 0.0 - 0.5 K/uL   Basophils Relative 1 %   Basophils Absolute 0.1 0.0 - 0.1 K/uL   Immature Granulocytes 2 %   Abs Immature Granulocytes 0.20 (H) 0.00 - 0.07 K/uL    Comment: Performed at [facility], [address] [place], [place] [address]  Hepatic function panel     Status: Abnormal   Collection Time: 04/12/21 11:30 PM  Result Value Ref Range   Total Protein 7.8 6.5 - 8.1 g/dL   Albumin 3.2 (L) 3.5 - 5.0 g/dL   AST 13 (L) 15 - 41 U/L   ALT 20 0 - 44 U/L   Alkaline Phosphatase 182 (H) 38 - 126 U/L   Total Bilirubin 2.2 (H) 0.3 - 1.2 mg/dL   Bilirubin, Direct 0.1 0.0 - 0.2 mg/dL   Indirect Bilirubin 2.1 (H) 0.3 - 0.9 mg/dL    Comment: Performed at [facility], [address] [place], [place] [address]  Resp Panel by RT-PCR (Flu A&B, Covid) Nasopharyngeal Swab     Status: None   Collection Time: 04/12/21 11:39 PM   Specimen: Nasopharyngeal Swab; Nasopharyngeal(NP) swabs in vial transport medium  Result Value Ref Range   SARS Coronavirus 2 by RT PCR NEGATIVE NEGATIVE    Comment: (NOTE) SARS-CoV-2 target nucleic acids are NOT DETECTED.  The SARS-CoV-2 RNA is generally detectable in upper respiratory specimens during the acute phase of infection. The lowest concentration of SARS-CoV-2  viral copies this assay can detect is 138 copies/mL. A negative result does not preclude SARS-Cov-2 infection and should not be used as the sole basis for treatment or other patient management decisions. A negative result may occur with  improper specimen collection/handling, submission of specimen other than nasopharyngeal swab, presence of viral mutation(s) within the areas targeted by this assay, and inadequate number of viral copies(<138 copies/mL). A negative result must be combined with clinical observations, patient history,  and epidemiological information. The expected result is Negative.  Fact Sheet for Patients:  BloggerCourse.com  Fact Sheet for Healthcare Providers:  SeriousBroker.it  This test is no t yet approved or cleared by the [place] FDA and  has been authorized for detection and/or diagnosis of SARS-CoV-2 by FDA under an Emergency Use Authorization (EUA). This EUA will remain  in effect (meaning this test can be used) for the duration of the COVID-19 declaration under Section 564(b)(1) of the Act, 21 U.S.C.section 360bbb-3(b)(1), unless the authorization is terminated  or revoked sooner.       Influenza A by PCR NEGATIVE NEGATIVE   Influenza B by PCR NEGATIVE NEGATIVE    Comment: (NOTE) The Xpert Xpress SARS-CoV-2/FLU/RSV plus assay is intended as an aid in the diagnosis of influenza from Nasopharyngeal swab specimens and should not be used as a sole basis for treatment. Nasal washings and aspirates are unacceptable for Xpert Xpress SARS-CoV-2/FLU/RSV testing.  Fact Sheet for Patients: BloggerCourse.com  Fact Sheet for Healthcare Providers: SeriousBroker.it  This test is not yet approved or cleared by the [place] FDA and has been authorized for detection and/or diagnosis of SARS-CoV-2 by FDA under an Emergency Use Authorization (EUA). This EUA  will remain in effect (meaning this test can be used) for the duration of the COVID-19 declaration under Section 564(b)(1) of the Act, 21 U.S.C. section 360bbb-3(b)(1), unless the authorization is terminated or revoked.  Performed at [facility], [address] [place], [place] [address]   CBG monitoring, ED     Status: Abnormal   Collection Time: 04/13/21 12:11 AM  Result Value Ref Range   Glucose-Capillary 569 (HH) 70 - 99 mg/dL    Comment: Glucose reference range applies only to samples taken after fasting for at least 8 hours.  Blood gas, arterial (at [facility])     Status: Abnormal   Collection Time: 04/13/21  1:08 AM  Result Value Ref Range   FIO2 21.00    pH, Arterial 7.167 (LL) 7.350 - 7.450    Comment: CRITICAL RESULT CALLED TO, READ BACK BY AND VERIFIED WITH: WALKER,T @ 0121 ON 04/13/21 BY JUW    pCO2 arterial <16.0 (LL) 32.0 - 48.0 mmHg    Comment: CRITICAL RESULT CALLED TO, READ BACK BY AND VERIFIED WITH: WALKER,T @ 0121 ON 04/13/21 BY JUW    pO2, Arterial 133 (H) 83.0 - 108.0 mmHg   O2 Saturation 98.5 %   Patient temperature 37.0    Allens test (pass/fail) PASS PASS    Comment: Performed at [facility], [address], [place], [place] [address]  CBG monitoring, ED     Status: Abnormal   Collection Time: 04/13/21  1:33 AM  Result Value Ref Range   Glucose-Capillary 475 (H) 70 - 99 mg/dL    Comment: Glucose reference range applies only to samples taken after fasting for at least 8 hours.    Chemistries  Recent Labs  Lab 04/12/21 2330  NA 130*  K 4.5  CL 103  CO2 <7*  GLUCOSE 538*  BUN 30*  CREATININE 1.27*  CALCIUM 8.7*  AST 13*  ALT 20  ALKPHOS 182*  BILITOT 2.2*   ------------------------------------------------------------------------------------------------------------------  ------------------------------------------------------------------------------------------------------------------ GFR: CrCl cannot be calculated (Unknown ideal  weight.). Liver Function Tests: Recent Labs  Lab 04/12/21 2330  AST 13*  ALT 20  ALKPHOS 182*  BILITOT 2.2*  PROT 7.8  ALBUMIN 3.2*   No results for input(s): LIPASE, AMYLASE in the last 168 hours. No results  for input(s): AMMONIA in the last 168 hours. Coagulation Profile: Recent Labs  Lab 04/12/21 2320  INR 1.3*   Cardiac Enzymes: No results for input(s): CKTOTAL, CKMB, CKMBINDEX, TROPONINI in the last 168 hours. BNP (last 3 results) No results for input(s): PROBNP in the last 8760 hours. HbA1C: No results for input(s): HGBA1C in the last 72 hours. CBG: Recent Labs  Lab 04/12/21 2231 04/13/21 0011 04/13/21 0133  GLUCAP 507* 569* 475*   Lipid Profile: No results for input(s): CHOL, HDL, LDLCALC, TRIG, CHOLHDL, LDLDIRECT in the last 72 hours. Thyroid Function Tests: No results for input(s): TSH, T4TOTAL, FREET4, T3FREE, THYROIDAB in the last 72 hours. Anemia Panel: No results for input(s): VITAMINB12, FOLATE, FERRITIN, TIBC, IRON, RETICCTPCT in the last 72 hours.  --------------------------------------------------------------------------------------------------------------- Urine analysis:    Component Value Date/Time   COLORURINE YELLOW 07/09/2017 1332   APPEARANCEUR CLEAR 07/09/2017 1332   LABSPEC 1.026 07/09/2017 1332   PHURINE 5.0 07/09/2017 1332   GLUCOSEU NEGATIVE 07/09/2017 1332   HGBUR NEGATIVE 07/09/2017 1332   BILIRUBINUR NEGATIVE 07/09/2017 1332   KETONESUR 80 (A) 07/09/2017 1332   PROTEINUR 30 (A) 07/09/2017 1332   UROBILINOGEN 0.2 04/09/2010 1452   NITRITE NEGATIVE 07/09/2017 1332   LEUKOCYTESUR NEGATIVE 07/09/2017 1332      Imaging Results:    CT FEMUR RIGHT WO CONTRAST  Result Date: 04/12/2021 CLINICAL DATA:  60 year old female with concern for soft tissue infection. EXAM: CT OF THE LOWER RIGHT EXTREMITY WITHOUT CONTRAST TECHNIQUE: Multidetector CT imaging of the right lower extremity was performed according to the standard protocol.  COMPARISON:  None. FINDINGS: Bones/Joint/Cartilage There is no acute fracture or dislocation. Mild arthritic changes of the right knee. Ligaments Suboptimally assessed by CT. Muscles and Tendons No acute findings. No fluid collection or intramuscular hematoma. Soft tissues There is inflammatory changes with small complex collection containing pockets of air in the subcutaneous soft tissues of the medial upper right thigh. There is apparent focal area of skin defect. Findings may represent cellulitis or an infectious etiology. The soft tissue gas may have been introduced via incision and drainage. Soft tissue air related to gangrene or necrotizing fasciitis is not excluded. Correlation with history of instrumentation recommended. No drainable fluid collection present. IMPRESSION: 1. Inflammatory changes with small complex collection containing pockets of air in the subcutaneous soft tissues of the medial upper right thigh. Findings may represent cellulitis or an infectious etiology. Soft tissue air may have been introduced during incision and drainage. Gangrene or necrotizing fasciitis is not excluded. Correlation with history of instrumentation recommended. No drainable fluid collection. 2. No acute fracture or dislocation. Electronically Signed   By: Elgie Collard M.D.   On: 04/12/2021 23:18   DG Chest Port 1 View  Result Date: 04/13/2021 CLINICAL DATA:  Possible sepsis EXAM: PORTABLE CHEST 1 VIEW COMPARISON:  06/10/2010 FINDINGS: The heart size and mediastinal contours are within normal limits. Both lungs are clear. The visualized skeletal structures are unremarkable. IMPRESSION: No active disease. Electronically Signed   By: Jasmine Pang M.D.   On: 04/13/2021 00:29       Assessment & Plan:    Principal Problem:   DKA (diabetic ketoacidosis) (HCC) Active Problems:   Abscess   SIRS (systemic inflammatory response syndrome) (HCC)   Mild protein-calorie malnutrition (HCC)   AKI (acute kidney  injury) (HCC)   1. DKA 1. pH 7.1, CO2 less than 7, glucose 538, gap not calculated  2. Continue insulin drip 3. Continue IV hydration 4. N.p.o. until patient  is able to transition from insulin drip 5. BMP every 4 hours 6. Monitor in ICU 2. Abscess with overlying cellulitis 1. CT scan shows inflammatory changes with small complex collection containing pockets of air in the subcutaneous soft tissue of the medial upper right thigh. 2. Abscess is open so area is anticipated in the subcutaneous tissue 3. Patient started on Vanco and Zosyn, continue vancomycin 4. Consult general surgery for possible I&D/debridement 3. SIRS 1. Heart rate 115, respiratory rate 26, lactic acid is normal, no qualifying endorgan damage to meet sepsis criteria 2. Secondary to abscess treat as above 4. Mild protein cal mal 1. Albumin 3.2 2. Encourage nutrient dense food choices when patient is able to tolerate p.o. intake after insulin drip 5. AKI 1. Baseline creatinine 0.81 2. Creatinine today 1.27 3. Holding Lasix continuing fluids 4. Trend with daily 4 hours BMP 6. HTN 1. , Systolic blood pressure read 82 2. Holding antihypertensives 3. Continue fluids 7. DMII 1. Insulin drip as above 8.    DVT Prophylaxis-   Heparin - SCDs   AM Labs Ordered, also please review Full Orders  Family Communication: No family at bedside  Code Status:  FULL  Admission status: Inpatient :The appropriate admission status for this patient is INPATIENT. Inpatient status is judged to be reasonable and necessary in order to provide the required intensity of service to ensure the patient's safety. The patient's presenting symptoms, physical exam findings, and initial radiographic and laboratory data in the context of their chronic comorbidities is felt to place them at high risk for further clinical deterioration. Furthermore, it is not anticipated that the patient will be medically stable for discharge from the hospital within  2 midnights of admission. The following factors support the admission status of inpatient.     The patient's presenting symptoms include fatigue The worrisome physical exam findings include draining abscess on right upper medial thigh The initial radiographic and laboratory data are worrisome because of  The chronic co-morbidities include T2DM, obesity, HLD, GERD       * I certify that at the point of admission it is my clinical judgment that the patient will require inpatient hospital care spanning beyond 2 midnights from the point of admission due to high intensity of service, high risk for further deterioration and high frequency of surveillance required.*  Time spent in minutes : 65  Caela Huot B Zierle-Ghosh DO

## 2021-04-13 NOTE — ED Provider Notes (Signed)
Care assumed from Dr. Hyacinth Meeker.  Patient here with hyperglycemia, altered mental status and confusion.  She is found to be hyperglycemic and tachycardic.  Evidently she is septic from an abscess to her right thigh and in DKA.  She started on broad-spectrum antibiotics and IV fluids.  Started on IV insulin.  CT scan shows some pockets of air within the abscess cavity itself which is open to air.  Lower suspicion for necrotizing fasciitis but would likely benefit from surgical consultation in the morning.  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  Continue IV fluid resuscitation, aggressive antibiotics and insulin infusion. Blood pressure and mental status remained stable throughout ED course.  No indication for intubation.  Did have 1 transient blood pressure reading in the 80s but seems to be spurious.  Her blood pressures have been 1 teens and 120s.  Her thigh abscess is already draining.  May need surgical evaluation in the morning.  Admission discussed with Dr. Carren Rang.   Marland Kitchen.Critical Care Performed by: Glynn Octave, MD Authorized by: Glynn Octave, MD   Critical care provider statement:    Critical care time (minutes):  35   Critical care was necessary to treat or prevent imminent or life-threatening deterioration of the following conditions:  Sepsis, endocrine crisis and dehydration   Critical care was time spent personally by me on the following activities:  Discussions with consultants, evaluation of patient's response to treatment, examination of patient, ordering and performing treatments and interventions, ordering and review of laboratory studies, ordering and review of radiographic studies, pulse oximetry, re-evaluation of patient's condition, obtaining history from patient or surrogate and review of old charts      Glynn Octave, MD 04/13/21 0710

## 2021-04-13 NOTE — ED Notes (Signed)
Date and time results received: 04/13/21 0122  Test: pH Critical Value: 7.167  Test: pCO2 Critical Value: <16  Name of Provider Notified: Rancour, MD  Orders Received? Or Actions Taken?: acknowledged

## 2021-04-13 NOTE — Consult Note (Addendum)
[facility] Consult  Reason for Consult: Abscess right thigh  Referring Physician:  Dr. Gwenlyn Perking   Chief Complaint    Altered Mental Status      HPI: Misty Hendrix is a 60 y.o. female with DKA, OSA, who comes in with a right thigh abscess and she reports a spider bite versus a centipede bite. She reportedly told someone her leg was hurting at some point.  The right thigh was noted to be draining and indurated on exam but the hospitalist team. A CT scan demonstrated the area with phlegmon and no major collection with some gas. The wound was opened and draining.  She is currently in the ICU and is oriented to self and Misty Hawking but very quick to fall asleep.   Past Medical History:  Diagnosis Date  . Anxiety   . Asthma    Dr. Sherene Sires - h/o cough, better off ACE-I  . Back pain, chronic   . Depression   . Diabetes mellitus   . Essential hypertension, benign   . GERD (gastroesophageal reflux disease)   . Lumbar radiculopathy   . Mixed hyperlipidemia   . Obesity   . Obstructive sleep apnea    CPAP-hasn" used since 2015  . Type 2 diabetes mellitus (HCC)     Past Surgical History:  Procedure Laterality Date  . CESAREAN SECTION  1993  . CHOLECYSTECTOMY N/A 04/29/2016   Procedure: LAPAROSCOPIC CHOLECYSTECTOMY;  Surgeon: De Blanch Kinsinger, MD;  Location: [facility];  Service: General;  Laterality: N/A;    Family History  Problem Relation Age of Onset  . Coronary artery disease Mother   . Coronary artery disease Father   . Cancer Brother        Throat  . COPD Paternal Aunt   . Asthma Maternal Uncle     Social History   Tobacco Use  . Smoking status: Former Smoker    Packs/day: 0.50    Years: 28.00    Pack years: 14.00    Types: Cigarettes    Quit date: 11/16/2003    Years since quitting: 17.4  . Smokeless tobacco: Current User  . Tobacco comment: e-cigarette w/out nicotine occa.  Substance Use Topics  . Alcohol use: No    Comment: None  since 1991  . Drug use: No    Medications:  I have reviewed the patient's current medications. Prior to Admission:  Medications Prior to Admission  Medication Sig Dispense Refill Last Dose  . albuterol (PROVENTIL HFA;VENTOLIN HFA) 108 (90 BASE) MCG/ACT inhaler Inhale 2 puffs into the lungs every 6 (six) hours as needed for wheezing or shortness of breath.     . ALPRAZolam (XANAX) 0.5 MG tablet TAKE ONE TABLET BY MOUTH ONCE DAILY AT BEDTIME AS NEEDED (Patient taking differently: TAKE ONE TABLET BY MOUTH ONCE DAILY AT BEDTIME AS NEEDED FOR REST. *MAY TAKE DAILY IF NEEDED) 30 tablet 0   . beclomethasone (QVAR) 80 MCG/ACT inhaler Inhale 1 puff into the lungs daily as needed (for shortness of breath). For shortness of breath     . cycloSPORINE (RESTASIS) 0.05 % ophthalmic emulsion Place 1 drop into both eyes daily as needed (dry eyes).      Marland Kitchen dicyclomine (BENTYL) 20 MG tablet Take 1 tablet (20 mg total) by mouth every 6 (six) hours as needed for spasms (abdominal cramping). 15 tablet 0   . fluconazole (DIFLUCAN) 150 MG tablet Take 150 mg by mouth daily as needed. For yeast     .  furosemide (LASIX) 20 MG tablet Take 20-40 mg by mouth daily as needed for fluid.     Marland Kitchen. gabapentin (NEURONTIN) 800 MG tablet Take 1 tablet (800 mg total) by mouth 3 (three) times daily. (Patient taking differently: Take 800-1,600 mg by mouth 2 (two) times daily. Sometimes takes two capsules once, then one later on for a total of  3 daily) 90 tablet 11   . losartan (COZAAR) 50 MG tablet Take 50 mg by mouth daily.     . ondansetron (ZOFRAN ODT) 4 MG disintegrating tablet Take 1 tablet (4 mg total) by mouth every 8 (eight) hours as needed for nausea or vomiting. 6 tablet 0   . rosuvastatin (CRESTOR) 20 MG tablet Take 20 mg by mouth daily.     . sitaGLIPtin (JANUVIA) 50 MG tablet Take 50 mg by mouth daily.     . Tetrahydrozoline HCl (VISINE OP) Apply 1 drop to eye daily as needed (dry eyes).      Scheduled: . Chlorhexidine  Gluconate Cloth  6 each Topical Q0600  . gabapentin  800 mg Oral TID  . heparin  5,000 Units Subcutaneous Q8H  . rosuvastatin  20 mg Oral Daily   Continuous: . dextrose 5% lactated ringers 125 mL/hr at 04/13/21 1515  . insulin 2.6 mL/hr at 04/13/21 1515  . lactated ringers Stopped (04/13/21 0455)  . [START ON 04/14/2021] vancomycin     ZOX:[id number]:acetaminophen **OR** acetaminophen, albuterol, ALPRAZolam, dextrose, ondansetron **OR** ondansetron (ZOFRAN) IV, oxyCODONE  Allergies  Allergen Reactions  . Olmesartan Rash  . Lisinopril Cough  . Benicar [Olmesartan Medoxomil] Rash  . Dextromethorphan-Guaifenesin Nausea And Vomiting     ROS:  Review of systems not obtained due to patient factors.  Answered minimal questions.   Blood pressure (!) 93/52, pulse 83, temperature 97.7 F (36.5 C), temperature source Oral, resp. rate 13, height 5\' 6"  (1.676 m), weight 100 kg, SpO2 100 %. Physical Exam Vitals reviewed.  Constitutional:      General: She is not in acute distress.    Comments: Arouses with aggressive stimulation   HENT:     Head: Normocephalic.     Nose: Nose normal.  Eyes:     Extraocular Movements: Extraocular movements intact.  Cardiovascular:     Rate and Rhythm: Normal rate.  Pulmonary:     Effort: Pulmonary effort is normal.  Abdominal:     General: There is no distension.     Palpations: Abdomen is soft.     Tenderness: There is no abdominal tenderness.  Musculoskeletal:     Comments: Right inner thigh with draining site and induration surrounding, purulent drainage, tender   Skin:    General: Skin is warm.  Neurological:     Mental Status: She is lethargic.     Comments: Oriented to self and Misty Hendrix and spider bite/ diabetes issues        Results: Results for orders placed or performed during the hospital encounter of 04/12/21 (from the past 48 hour(s))  CBG monitoring, ED     Status: Abnormal   Collection Time: 04/12/21 10:31 PM  Result Value Ref Range    Glucose-Capillary 507 (HH) 70 - 99 mg/dL    Comment: Glucose reference range applies only to samples taken after fasting for at least 8 hours.   Comment 1 Notify RN    Comment 2 Document in Chart   Blood Culture (routine x 2)     Status: None (Preliminary result)   Collection Time: 04/12/21 11:16 PM  Specimen: BLOOD LEFT ARM  Result Value Ref Range   Specimen Description BLOOD LEFT ARM    Special Requests      BOTTLES DRAWN AEROBIC ONLY Blood Culture adequate volume   Culture      NO GROWTH < 24 HOURS Performed at [facility], [address] [place], [place] [address]    Report Status PENDING   Beta-hydroxybutyric acid     Status: Abnormal   Collection Time: 04/12/21 11:20 PM  Result Value Ref Range   Beta-Hydroxybutyric Acid 7.62 (H) 0.05 - 0.27 mmol/L    Comment: Performed at [facility], [address], [place], [place] [address]  Lactic acid, plasma     Status: None   Collection Time: 04/12/21 11:20 PM  Result Value Ref Range   Lactic Acid, Venous 1.4 0.5 - 1.9 mmol/L    Comment: Performed at [facility], [address] [place], [place] [address]  Protime-INR     Status: Abnormal   Collection Time: 04/12/21 11:20 PM  Result Value Ref Range   Prothrombin Time 16.3 (H) 11.4 - 15.2 seconds   INR 1.3 (H) 0.8 - 1.2    Comment: (NOTE) INR goal varies based on device and disease states. Performed at [facility], [address] [place], [place] [address]   APTT     Status: Abnormal   Collection Time: 04/12/21 11:20 PM  Result Value Ref Range   aPTT 22 (L) 24 - 36 seconds    Comment: Performed at [facility], [address], [place], [place] [address]  Blood Culture (routine x 2)     Status: None (Preliminary result)   Collection Time: 04/12/21 11:25 PM   Specimen: BLOOD LEFT HAND  Result Value Ref Range   Specimen Description BLOOD LEFT HAND    Special Requests      BOTTLES DRAWN AEROBIC ONLY Blood Culture adequate volume   Culture      NO GROWTH < 24  HOURS Performed at [facility], [address] [place], [place] [address]    Report Status PENDING   Basic metabolic panel     Status: Abnormal   Collection Time: 04/12/21 11:30 PM  Result Value Ref Range   Sodium 130 (L) 135 - 145 mmol/L   Potassium 4.5 3.5 - 5.1 mmol/L   Chloride 103 98 - 111 mmol/L   CO2 <7 (L) 22 - 32 mmol/L   Glucose, Bld 538 (HH) 70 - 99 mg/dL    Comment: Glucose reference range applies only to samples taken after fasting for at least 8 hours. CRITICAL RESULT CALLED TO, READ BACK BY AND VERIFIED WITH: Lockie Pares 0041 04/13/2021 COLEMAN,R    BUN 30 (H) 8 - 23 mg/dL   Creatinine, Ser 9.37 (H) 0.44 - 1.00 mg/dL   Calcium 8.7 (L) 8.9 - 10.3 mg/dL   GFR, Estimated 48 (L) >60 mL/min    Comment: (NOTE) Calculated using the CKD-EPI Creatinine Equation (2021)    Anion gap ELECTROLYTES REPEATED TO CONFIRM 5 - 15    Comment: Performed at [facility], [address], [place], [place] [address]  CBC with Differential (PNL)     Status: Abnormal   Collection Time: 04/12/21 11:30 PM  Result Value Ref Range   WBC 11.1 (H) 4.0 - 10.5 K/uL   RBC 4.54 3.87 - 5.11 MIL/uL   Hemoglobin 14.4 12.0 - 15.0 g/dL   HCT 89.3 81.0 - 17.5 %   MCV 96.3 80.0 - 100.0 fL   MCH 31.7 26.0 -  34.0 pg   MCHC 33.0 30.0 - 36.0 g/dL   RDW 78.2 95.6 - 21.3 %   Platelets 471 (H) 150 - 400 K/uL   nRBC 0.0 0.0 - 0.2 %   Neutrophils Relative % 66 %   Neutro Abs 7.3 1.7 - 7.7 K/uL   Lymphocytes Relative 20 %   Lymphs Abs 2.2 0.7 - 4.0 K/uL   Monocytes Relative 11 %   Monocytes Absolute 1.2 (H) 0.1 - 1.0 K/uL   Eosinophils Relative 0 %   Eosinophils Absolute 0.0 0.0 - 0.5 K/uL   Basophils Relative 1 %   Basophils Absolute 0.1 0.0 - 0.1 K/uL   Immature Granulocytes 2 %   Abs Immature Granulocytes 0.20 (H) 0.00 - 0.07 K/uL    Comment: Performed at [facility], [address] [place], [place] [address]  Hepatic function panel     Status: Abnormal   Collection Time: 04/12/21 11:30 PM  Result  Value Ref Range   Total Protein 7.8 6.5 - 8.1 g/dL   Albumin 3.2 (L) 3.5 - 5.0 g/dL   AST 13 (L) 15 - 41 U/L   ALT 20 0 - 44 U/L   Alkaline Phosphatase 182 (H) 38 - 126 U/L   Total Bilirubin 2.2 (H) 0.3 - 1.2 mg/dL   Bilirubin, Direct 0.1 0.0 - 0.2 mg/dL   Indirect Bilirubin 2.1 (H) 0.3 - 0.9 mg/dL    Comment: Performed at [facility], [address], [place], [place] [address]  Resp Panel by RT-PCR (Flu A&B, Covid) Nasopharyngeal Swab     Status: None   Collection Time: 04/12/21 11:39 PM   Specimen: Nasopharyngeal Swab; Nasopharyngeal(NP) swabs in vial transport medium  Result Value Ref Range   SARS Coronavirus 2 by RT PCR NEGATIVE NEGATIVE    Comment: (NOTE) SARS-CoV-2 target nucleic acids are NOT DETECTED.  The SARS-CoV-2 RNA is generally detectable in upper respiratory specimens during the acute phase of infection. The lowest concentration of SARS-CoV-2 viral copies this assay can detect is 138 copies/mL. A negative result does not preclude SARS-Cov-2 infection and should not be used as the sole basis for treatment or other patient management decisions. A negative result may occur with  improper specimen collection/handling, submission of specimen other than nasopharyngeal swab, presence of viral mutation(s) within the areas targeted by this assay, and inadequate number of viral copies(<138 copies/mL). A negative result must be combined with clinical observations, patient history, and epidemiological information. The expected result is Negative.  Fact Sheet for Patients:  BloggerCourse.com  Fact Sheet for Healthcare Providers:  SeriousBroker.it  This test is no t yet approved or cleared by the [place] FDA and  has been authorized for detection and/or diagnosis of SARS-CoV-2 by FDA under an Emergency Use Authorization (EUA). This EUA will remain  in effect (meaning this test can be used) for the duration of  the COVID-19 declaration under Section 564(b)(1) of the Act, 21 U.S.C.section 360bbb-3(b)(1), unless the authorization is terminated  or revoked sooner.       Influenza A by PCR NEGATIVE NEGATIVE   Influenza B by PCR NEGATIVE NEGATIVE    Comment: (NOTE) The Xpert Xpress SARS-CoV-2/FLU/RSV plus assay is intended as an aid in the diagnosis of influenza from Nasopharyngeal swab specimens and should not be used as a sole basis for treatment. Nasal washings and aspirates are unacceptable for Xpert Xpress SARS-CoV-2/FLU/RSV testing.  Fact Sheet for Patients: BloggerCourse.com  Fact Sheet for Healthcare Providers: SeriousBroker.it  This test is not yet  approved or cleared by the [place] and has been authorized for detection and/or diagnosis of SARS-CoV-2 by FDA under an Emergency Use Authorization (EUA). This EUA will remain in effect (meaning this test can be used) for the duration of the COVID-19 declaration under Section 564(b)(1) of the Act, 21 U.S.C. section 360bbb-3(b)(1), unless the authorization is terminated or revoked.  Performed at [facility], [address] [place], [place] [address]   CBG monitoring, ED     Status: Abnormal   Collection Time: 04/13/21 12:11 AM  Result Value Ref Range   Glucose-Capillary 569 (HH) 70 - 99 mg/dL    Comment: Glucose reference range applies only to samples taken after fasting for at least 8 hours.  Blood gas, arterial (at [facility])     Status: Abnormal   Collection Time: 04/13/21  1:08 AM  Result Value Ref Range   FIO2 21.00    pH, Arterial 7.167 (LL) 7.350 - 7.450    Comment: CRITICAL RESULT CALLED TO, READ BACK BY AND VERIFIED WITH: WALKER,T @ 0121 ON 04/13/21 BY JUW    pCO2 arterial <16.0 (LL) 32.0 - 48.0 mmHg    Comment: CRITICAL RESULT CALLED TO, READ BACK BY AND VERIFIED WITH: WALKER,T @ 0121 ON 04/13/21 BY JUW    pO2, Arterial 133 (H) 83.0 - 108.0 mmHg   O2 Saturation  98.5 %   Patient temperature 37.0    Allens test (pass/fail) PASS PASS    Comment: Performed at [facility], [address], [place], [place] [address]  CBG monitoring, ED     Status: Abnormal   Collection Time: 04/13/21  1:33 AM  Result Value Ref Range   Glucose-Capillary 475 (H) 70 - 99 mg/dL    Comment: Glucose reference range applies only to samples taken after fasting for at least 8 hours.  CBG monitoring, ED     Status: Abnormal   Collection Time: 04/13/21  2:21 AM  Result Value Ref Range   Glucose-Capillary 342 (H) 70 - 99 mg/dL    Comment: Glucose reference range applies only to samples taken after fasting for at least 8 hours.  Urinalysis, Routine w reflex microscopic Urine, Clean Catch     Status: Abnormal   Collection Time: 04/13/21  2:30 AM  Result Value Ref Range   Color, Urine YELLOW YELLOW   APPearance CLOUDY (A) CLEAR   Specific Gravity, Urine 1.021 1.005 - 1.030   pH 5.0 5.0 - 8.0   Glucose, UA >=500 (A) NEGATIVE mg/dL   Hgb urine dipstick LARGE (A) NEGATIVE   Bilirubin Urine NEGATIVE NEGATIVE   Ketones, ur 80 (A) NEGATIVE mg/dL   Protein, ur 119 (A) NEGATIVE mg/dL   Nitrite NEGATIVE NEGATIVE   Leukocytes,Ua LARGE (A) NEGATIVE   RBC / HPF >50 (H) 0 - 5 RBC/hpf   WBC, UA 21-50 0 - 5 WBC/hpf   Bacteria, UA MANY (A) NONE SEEN   Squamous Epithelial / LPF 0-5 0 - 5   Mucus PRESENT     Comment: Performed at [facility], [address], [place], [place] [address]  CBG monitoring, ED     Status: Abnormal   Collection Time: 04/13/21  3:44 AM  Result Value Ref Range   Glucose-Capillary 258 (H) 70 - 99 mg/dL    Comment: Glucose reference range applies only to samples taken after fasting for at least 8 hours.  CBG monitoring, ED     Status: Abnormal   Collection Time: 04/13/21  4:49 AM  Result Value Ref Range   Glucose-Capillary 186 (H) 70 - 99 mg/dL    Comment: Glucose reference range applies only to samples taken after fasting for at least 8 hours.  CBG monitoring,  ED     Status: Abnormal   Collection Time: 04/13/21  5:53 AM  Result Value Ref Range   Glucose-Capillary 189 (H) 70 - 99 mg/dL    Comment: Glucose reference range applies only to samples taken after fasting for at least 8 hours.  Protime-INR     Status: Abnormal   Collection Time: 04/13/21  6:00 AM  Result Value Ref Range   Prothrombin Time 15.4 (H) 11.4 - 15.2 seconds   INR 1.2 0.8 - 1.2    Comment: (NOTE) INR goal varies based on device and disease states. Performed at [facility], [address], [place], [place] [address]   Procalcitonin     Status: None   Collection Time: 04/13/21  6:00 AM  Result Value Ref Range   Procalcitonin 0.32 ng/mL    Comment:        Interpretation: PCT (Procalcitonin) <= 0.5 ng/mL: Systemic infection (sepsis) is not likely. Local bacterial infection is possible. (NOTE)       Sepsis PCT Algorithm           Lower Respiratory Tract                                      Infection PCT Algorithm    ----------------------------     ----------------------------         PCT < 0.25 ng/mL                PCT < 0.10 ng/mL          Strongly encourage             Strongly discourage   discontinuation of antibiotics    initiation of antibiotics    ----------------------------     -----------------------------       PCT 0.25 - 0.50 ng/mL            PCT 0.10 - 0.25 ng/mL               OR       >80% decrease in PCT            Discourage initiation of                                            antibiotics      Encourage discontinuation           of antibiotics    ----------------------------     -----------------------------         PCT >= 0.50 ng/mL              PCT 0.26 - 0.50 ng/mL               AND        <80% decrease in PCT             Encourage initiation of  antibiotics       Encourage continuation           of antibiotics    ----------------------------     -----------------------------        PCT >= 0.50  ng/mL                  PCT > 0.50 ng/mL               AND         increase in PCT                  Strongly encourage                                      initiation of antibiotics    Strongly encourage escalation           of antibiotics                                     -----------------------------                                           PCT <= 0.25 ng/mL                                                 OR                                        > 80% decrease in PCT                                      Discontinue / Do not initiate                                             antibiotics  Performed at [facility], [address] [place], [place] [address]   Magnesium     Status: None   Collection Time: 04/13/21  6:00 AM  Result Value Ref Range   Magnesium 1.7 1.7 - 2.4 mg/dL    Comment: Performed at [facility], [address], [place], [place] [address]  Comprehensive metabolic panel     Status: Abnormal   Collection Time: 04/13/21  6:00 AM  Result Value Ref Range   Sodium 134 (L) 135 - 145 mmol/L   Potassium 3.3 (L) 3.5 - 5.1 mmol/L    Comment: DELTA CHECK NOTED   Chloride 110 98 - 111 mmol/L   CO2 13 (L) 22 - 32 mmol/L   Glucose, Bld 185 (H) 70 - 99 mg/dL    Comment: Glucose reference range applies only to samples taken after fasting for at least 8 hours.   BUN 25 (H) 8 - 23 mg/dL   Creatinine, Ser 0.98 0.44 - 1.00 mg/dL  Calcium 8.7 (L) 8.9 - 10.3 mg/dL   Total Protein 6.3 (L) 6.5 - 8.1 g/dL   Albumin 2.6 (L) 3.5 - 5.0 g/dL   AST 12 (L) 15 - 41 U/L   ALT 16 0 - 44 U/L   Alkaline Phosphatase 142 (H) 38 - 126 U/L   Total Bilirubin 1.1 0.3 - 1.2 mg/dL   GFR, Estimated >16 >10 mL/min    Comment: (NOTE) Calculated using the CKD-EPI Creatinine Equation (2021)    Anion gap 11 5 - 15    Comment: Performed at [facility], [address], [place], [place] [address]  CBC WITH DIFFERENTIAL     Status: Abnormal   Collection Time: 04/13/21  6:00 AM  Result Value Ref Range    WBC 11.9 (H) 4.0 - 10.5 K/uL   RBC 3.74 (L) 3.87 - 5.11 MIL/uL   Hemoglobin 11.9 (L) 12.0 - 15.0 g/dL   HCT 40.9 (L) 81.1 - 91.4 %   MCV 93.6 80.0 - 100.0 fL   MCH 31.8 26.0 - 34.0 pg   MCHC 34.0 30.0 - 36.0 g/dL   RDW 78.2 95.6 - 21.3 %   Platelets 421 (H) 150 - 400 K/uL   nRBC 0.0 0.0 - 0.2 %   Neutrophils Relative % 57 %   Neutro Abs 6.8 1.7 - 7.7 K/uL   Lymphocytes Relative 25 %   Lymphs Abs 2.9 0.7 - 4.0 K/uL   Monocytes Relative 16 %   Monocytes Absolute 1.9 (H) 0.1 - 1.0 K/uL   Eosinophils Relative 0 %   Eosinophils Absolute 0.0 0.0 - 0.5 K/uL   Basophils Relative 1 %   Basophils Absolute 0.1 0.0 - 0.1 K/uL   Immature Granulocytes 1 %   Abs Immature Granulocytes 0.13 (H) 0.00 - 0.07 K/uL    Comment: Performed at [facility], [address] [place], [place] [address]  Beta-hydroxybutyric acid     Status: Abnormal   Collection Time: 04/13/21  6:20 AM  Result Value Ref Range   Beta-Hydroxybutyric Acid 2.80 (H) 0.05 - 0.27 mmol/L    Comment: Performed at [facility], [address], [place], [place] [address]  Lactic acid, plasma     Status: None   Collection Time: 04/13/21  6:20 AM  Result Value Ref Range   Lactic Acid, Venous 1.4 0.5 - 1.9 mmol/L    Comment: Performed at [facility], [address] [place], [place] [address]  Blood gas, venous     Status: Abnormal   Collection Time: 04/13/21  6:22 AM  Result Value Ref Range   FIO2 21.00    pH, Ven 7.261 7.250 - 7.430   pCO2, Ven 28.7 (L) 44.0 - 60.0 mmHg   pO2, Ven 49.6 (H) 32.0 - 45.0 mmHg   Bicarbonate 14.2 (L) 20.0 - 28.0 mmol/L   Acid-base deficit 13.2 (H) 0.0 - 2.0 mmol/L   O2 Saturation 85.0 %   Patient temperature 36.5     Comment: Performed at [facility], [address], [place], [place] [address]  CBG monitoring, ED     Status: Abnormal   Collection Time: 04/13/21  6:53 AM  Result Value Ref Range   Glucose-Capillary 169 (H) 70 - 99 mg/dL    Comment: Glucose reference range applies only to samples  taken after fasting for at least 8 hours.  CBG monitoring, ED     Status: Abnormal   Collection Time: 04/13/21  8:02 AM  Result Value Ref Range   Glucose-Capillary 224 (H)  70 - 99 mg/dL    Comment: Glucose reference range applies only to samples taken after fasting for at least 8 hours.  MRSA PCR Screening     Status: None   Collection Time: 04/13/21  8:42 AM   Specimen: Nasal Mucosa; Nasopharyngeal  Result Value Ref Range   MRSA by PCR NEGATIVE NEGATIVE    Comment:        The GeneXpert MRSA Assay (FDA approved for NASAL specimens only), is one component of a comprehensive MRSA colonization surveillance program. It is not intended to diagnose MRSA infection nor to guide or monitor treatment for MRSA infections. Performed at [facility], [address], [place], [place] [address]   Glucose, capillary     Status: Abnormal   Collection Time: 04/13/21  8:59 AM  Result Value Ref Range   Glucose-Capillary 214 (H) 70 - 99 mg/dL    Comment: Glucose reference range applies only to samples taken after fasting for at least 8 hours.  Glucose, capillary     Status: Abnormal   Collection Time: 04/13/21 10:05 AM  Result Value Ref Range   Glucose-Capillary 250 (H) 70 - 99 mg/dL    Comment: Glucose reference range applies only to samples taken after fasting for at least 8 hours.  Glucose, capillary     Status: Abnormal   Collection Time: 04/13/21 11:03 AM  Result Value Ref Range   Glucose-Capillary 183 (H) 70 - 99 mg/dL    Comment: Glucose reference range applies only to samples taken after fasting for at least 8 hours.  Glucose, capillary     Status: Abnormal   Collection Time: 04/13/21 12:05 PM  Result Value Ref Range   Glucose-Capillary 149 (H) 70 - 99 mg/dL    Comment: Glucose reference range applies only to samples taken after fasting for at least 8 hours.  Basic metabolic panel     Status: Abnormal   Collection Time: 04/13/21 12:21 PM  Result Value Ref Range   Sodium 136 135 - 145  mmol/L   Potassium 3.5 3.5 - 5.1 mmol/L   Chloride 111 98 - 111 mmol/L   CO2 14 (L) 22 - 32 mmol/L   Glucose, Bld 177 (H) 70 - 99 mg/dL    Comment: Glucose reference range applies only to samples taken after fasting for at least 8 hours.   BUN 23 8 - 23 mg/dL   Creatinine, Ser 2.44 0.44 - 1.00 mg/dL   Calcium 8.9 8.9 - 01.0 mg/dL   GFR, Estimated >27 >25 mL/min    Comment: (NOTE) Calculated using the CKD-EPI Creatinine Equation (2021)    Anion gap 11 5 - 15    Comment: Performed at [facility], [address] [place], [place] [address]  Glucose, capillary     Status: Abnormal   Collection Time: 04/13/21  1:04 PM  Result Value Ref Range   Glucose-Capillary 190 (H) 70 - 99 mg/dL    Comment: Glucose reference range applies only to samples taken after fasting for at least 8 hours.  Glucose, capillary     Status: Abnormal   Collection Time: 04/13/21  2:17 PM  Result Value Ref Range   Glucose-Capillary 164 (H) 70 - 99 mg/dL    Comment: Glucose reference range applies only to samples taken after fasting for at least 8 hours.  Blood gas, arterial     Status: Abnormal   Collection Time: 04/13/21  2:50 PM  Result Value Ref Range   FIO2 21.00    pH,  Arterial 7.381 7.350 - 7.450   pCO2 arterial 28.5 (L) 32.0 - 48.0 mmHg   pO2, Arterial 87.4 83.0 - 108.0 mmHg   Bicarbonate 18.7 (L) 20.0 - 28.0 mmol/L   Acid-base deficit 7.6 (H) 0.0 - 2.0 mmol/L   O2 Saturation 97.8 %   Patient temperature 36.5    Allens test (pass/fail) PASS PASS    Comment: Performed at [facility], [address] [place], [place] [address]   Personally reviewed- soft tissue with gas and opened wound, inflammation in the area  CT FEMUR RIGHT WO CONTRAST  Result Date: 04/12/2021 CLINICAL DATA:  60 year old female with concern for soft tissue infection. EXAM: CT OF THE LOWER RIGHT EXTREMITY WITHOUT CONTRAST TECHNIQUE: Multidetector CT imaging of the right lower extremity was performed according to the standard protocol.  COMPARISON:  None. FINDINGS: Bones/Joint/Cartilage There is no acute fracture or dislocation. Mild arthritic changes of the right knee. Ligaments Suboptimally assessed by CT. Muscles and Tendons No acute findings. No fluid collection or intramuscular hematoma. Soft tissues There is inflammatory changes with small complex collection containing pockets of air in the subcutaneous soft tissues of the medial upper right thigh. There is apparent focal area of skin defect. Findings may represent cellulitis or an infectious etiology. The soft tissue gas may have been introduced via incision and drainage. Soft tissue air related to gangrene or necrotizing fasciitis is not excluded. Correlation with history of instrumentation recommended. No drainable fluid collection present. IMPRESSION: 1. Inflammatory changes with small complex collection containing pockets of air in the subcutaneous soft tissues of the medial upper right thigh. Findings may represent cellulitis or an infectious etiology. Soft tissue air may have been introduced during incision and drainage. Gangrene or necrotizing fasciitis is not excluded. Correlation with history of instrumentation recommended. No drainable fluid collection. 2. No acute fracture or dislocation. Electronically Signed   By: Elgie Collard M.D.   On: 04/12/2021 23:18   DG Chest Port 1 View  Result Date: 04/13/2021 CLINICAL DATA:  Possible sepsis EXAM: PORTABLE CHEST 1 VIEW COMPARISON:  06/10/2010 FINDINGS: The heart size and mediastinal contours are within normal limits. Both lungs are clear. The visualized skeletal structures are unremarkable. IMPRESSION: No active disease. Electronically Signed   By: Jasmine Pang M.D.   On: 04/13/2021 00:29    Procedure: Incision and drainage of right thigh abscess  Diagnosis: Right thigh abscess, spider bite  Description: Consent was obtained from her daughter, Misty Hendrix.  Chloraprep was used to clean the area. Lidocaine 1% was injected  into the area surrounding the open site of the wound where purulence was draining. Using an 11 blade, the opening extended with an ellipse of skin removed.  Cultures were obtained. The wound was irrigated and loculations were freed up.  The wound was very indurated with some necrotic fat. Kerlix was packed into the wound and covered with an ABD and papertape.    Assessment & Plan:  Misty Hendrix is a 60 y.o. female with a right thigh abscess possibly from a spider bite with some fat necrosis and induration.  Area has been opened up for packing.  -BID packing by RN with saline dampened kerlix and ABD papertape to cover  -Cultures pending  -Continue antibiotics  -Will monitor wound to see if additional debridement needed given potential of bite   Updated Dr. Gwenlyn Perking.   Lucretia Roers 04/13/2021, 3:10 PM

## 2021-04-14 DIAGNOSIS — E785 Hyperlipidemia, unspecified: Secondary | ICD-10-CM

## 2021-04-14 LAB — MAGNESIUM: Magnesium: 1.6 mg/dL — ABNORMAL LOW (ref 1.7–2.4)

## 2021-04-14 LAB — BASIC METABOLIC PANEL
Anion gap: 8 (ref 5–15)
BUN: 17 mg/dL (ref 8–23)
CO2: 19 mmol/L — ABNORMAL LOW (ref 22–32)
Calcium: 9.2 mg/dL (ref 8.9–10.3)
Chloride: 113 mmol/L — ABNORMAL HIGH (ref 98–111)
Creatinine, Ser: 0.68 mg/dL (ref 0.44–1.00)
GFR, Estimated: >60 mL/min (ref >60)
Glucose, Bld: 127 mg/dL — ABNORMAL HIGH (ref 70–99)
Potassium: 2.9 mmol/L — ABNORMAL LOW (ref 3.5–5.1)
Sodium: 140 mmol/L (ref 135–145)

## 2021-04-14 LAB — GLUCOSE, CAPILLARY
Glucose-Capillary: 137 mg/dL — ABNORMAL HIGH (ref 70–99)
Glucose-Capillary: 167 mg/dL — ABNORMAL HIGH (ref 70–99)
Glucose-Capillary: 168 mg/dL — ABNORMAL HIGH (ref 70–99)
Glucose-Capillary: 168 mg/dL — ABNORMAL HIGH (ref 70–99)
Glucose-Capillary: 174 mg/dL — ABNORMAL HIGH (ref 70–99)
Glucose-Capillary: 178 mg/dL — ABNORMAL HIGH (ref 70–99)
Glucose-Capillary: 230 mg/dL — ABNORMAL HIGH (ref 70–99)
Glucose-Capillary: 233 mg/dL — ABNORMAL HIGH (ref 70–99)
Glucose-Capillary: 246 mg/dL — ABNORMAL HIGH (ref 70–99)
Glucose-Capillary: 251 mg/dL — ABNORMAL HIGH (ref 70–99)
Glucose-Capillary: 260 mg/dL — ABNORMAL HIGH (ref 70–99)
Glucose-Capillary: 273 mg/dL — ABNORMAL HIGH (ref 70–99)

## 2021-04-14 LAB — CBC
HCT: 35.6 % — ABNORMAL LOW (ref 36.0–46.0)
Hemoglobin: 12.2 g/dL (ref 12.0–15.0)
MCH: 31.6 pg (ref 26.0–34.0)
MCHC: 34.3 g/dL (ref 30.0–36.0)
MCV: 92.2 fL (ref 80.0–100.0)
Platelets: 333 10*3/uL (ref 150–400)
RBC: 3.86 MIL/uL — ABNORMAL LOW (ref 3.87–5.11)
RDW: 13.3 % (ref 11.5–15.5)
WBC: 9.4 10*3/uL (ref 4.0–10.5)
nRBC: 0 % (ref 0.0–0.2)

## 2021-04-14 LAB — BETA-HYDROXYBUTYRIC ACID: Beta-Hydroxybutyric Acid: 1.71 mmol/L — ABNORMAL HIGH (ref 0.05–0.27)

## 2021-04-14 LAB — HEMOGLOBIN A1C
Hgb A1c MFr Bld: >15.5 % — ABNORMAL HIGH (ref 4.8–5.6)
Mean Plasma Glucose: >398 mg/dL

## 2021-04-14 MED ORDER — INSULIN DETEMIR 100 UNIT/ML ~~LOC~~ SOLN
10.0000 [IU] | Freq: Two times a day (BID) | SUBCUTANEOUS | Status: DC
  Administered 2021-04-14 – 2021-04-16 (×5): 10 [IU] via SUBCUTANEOUS
  Filled 2021-04-14 (×7): qty 0.1

## 2021-04-14 MED ORDER — INSULIN ASPART 100 UNIT/ML IJ SOLN
0.0000 [IU] | Freq: Three times a day (TID) | INTRAMUSCULAR | Status: DC
  Administered 2021-04-14: 5 [IU] via SUBCUTANEOUS
  Administered 2021-04-15: 8 [IU] via SUBCUTANEOUS
  Administered 2021-04-15: 3 [IU] via SUBCUTANEOUS
  Administered 2021-04-15: 11 [IU] via SUBCUTANEOUS
  Administered 2021-04-16: 8 [IU] via SUBCUTANEOUS
  Administered 2021-04-16: 5 [IU] via SUBCUTANEOUS

## 2021-04-14 MED ORDER — MAGNESIUM SULFATE IN D5W 1-5 GM/100ML-% IV SOLN
1.0000 g | Freq: Once | INTRAVENOUS | Status: AC
  Administered 2021-04-14: 1 g via INTRAVENOUS
  Filled 2021-04-14: qty 100

## 2021-04-14 MED ORDER — POTASSIUM CHLORIDE CRYS ER 20 MEQ PO TBCR
40.0000 meq | EXTENDED_RELEASE_TABLET | ORAL | Status: AC
  Administered 2021-04-14 (×3): 40 meq via ORAL
  Filled 2021-04-14 (×3): qty 2

## 2021-04-14 MED ORDER — INSULIN ASPART 100 UNIT/ML IJ SOLN
0.0000 [IU] | Freq: Every day | INTRAMUSCULAR | Status: DC
  Administered 2021-04-14: 3 [IU] via SUBCUTANEOUS
  Administered 2021-04-15: 4 [IU] via SUBCUTANEOUS

## 2021-04-14 NOTE — Progress Notes (Addendum)
PROGRESS NOTE    Misty Hendrix  EXH:[id number] DOB: May 15, 1961 DOA: 04/12/2021 PCP: Scotty Court, DO   Chief Complaint  Patient presents with  . Altered Mental Status    Brief admission Narrative:  Darrian Goodwill  is a 60 y.o. female, with history of type 2 diabetes mellitus, obstructive sleep apnea, mixed hyperlipidemia, GERD, essential hypertension, and more presents the ED with a chief complaint of fatigue.  Unfortunately patient herself is not able to provide any history.  She is oriented to person place and time, but then on further questioning just continues to repeat the year, not answering other questions.  When she finally is able to stop perseverating on the year, she reports she does not know why she is here and her daughter told her she had to come.  Apparently when she first came in with EMS she was IVC'ed, but that paperwork was rescinded as patient was not thought to be a risk of harm to self.  ED provider reports that family had complained of progressive fatigue, and that family was concerned patient was not taking care of herself properly at home.  Patient is not able to tell me her medications or what her blood sugars have been running so it is quite possible that she is not able to take care of herself at home.  Patient reports that she is in no pain at the time of my exam.  She reports intermittent RLE pain and chills.  Assessment & Plan: 1-sepsis due to right inner thigh abscess -Patient met sepsis criteria on admission with specific source of infection identified (cellulitis/abscess process), elevated WBCs, tachycardia and tachypnea. -Sepsis features resolving at this time -Continue to maintain adequate hydration and continue the use of current IV antibiotics -Follow final culture results.  2-DKA (diabetic ketoacidosis) ([place]) -In the setting of medication noncompliance -A1c more than 15.5; demonstrating very poor control. -Anion gap is closed, bicarb above 19 (at  target for transitioning off insulin drip); CBGs with a slight fluctuation but for the most part below 200 for more than 3 occasions -Patient will be started on Levemir twice a day along with sliding scale insulin -Continue to closely follow CBGs and continue to maintain adequate hydration  3-AKI (acute kidney injury) ([place]) -In the setting of prerenal azotemia, dehydration and acute infection/sepsis -Creatinine was elevated at 1.27 on presentation -Now back to normal range after fluid resuscitation and a stabilization of her conditions. -Will continue to follow renal function for stability. -Continue to maintain adequate hydration and minimize the use of nephrotoxic agents.  4-hyperlipidemia -Continue statins  5-hypokalemia -In the setting of insulin therapy for DKA process and decreased oral intake -Electrolytes will be repleted -Will check magnesium level and follow potassium trend for instability.  6-Acute metabolic encephalopathy -present at time of admission -improving/resolving -most likely associated with sepsis and DKA. -currently oriented X 3.  DVT prophylaxis: Heparin Code Status: Full code Family Communication: No family at bedside. Disposition:   Status is: Inpatient  Dispo: The patient is from: Home              Anticipated d/c is to: Home              Patient currently no medically stable for discharge yet; final culture results and sensitivity from right inner thigh abscess are pending; patient transitioning off insulin drip and is still demonstrating fluctuating CBGs.   Difficult to place patient No     Consultants:   General surgery  Procedures:  See below for x-ray reports Right inner thigh I&D 04/13/2021   Antimicrobials: Vancomycin   Subjective: No fever, no chest pain, no nausea, no vomiting.  Expressing pain in her right inner thigh (improve in comparison to discomfort at time of admission).  Insulin drip ongoing.  Objective: Vitals:    04/14/21 0900 04/14/21 1000 04/14/21 1100 04/14/21 1112  BP: (!) 114/56 127/73 130/60   Pulse: 87 88 83 87  Resp: 17 13 14  (!) 24  Temp:    98.4 F (36.9 C)  TempSrc:    Oral  SpO2: 100% 100% 100% 100%  Weight:      Height:        Intake/Output Summary (Last 24 hours) at 04/14/2021 1141 Last data filed at 04/14/2021 0500 Gross per 24 hour  Intake 1438.58 ml  Output 400 ml  Net 1038.58 ml   Filed Weights   04/13/21 0848  Weight: 100 kg    Examination:  General exam: Appears calm and comfortable; following commands appropriately during evaluation patient was oriented x3.  No fever.  Reports no chest pain, shortness of breath, nausea or vomiting.  Elicited some pain in her right inner thigh after I&D. Respiratory system: Good air movement bilaterally; no wheezing, no crackles, no use of accessory muscles appreciated. Cardiovascular system: S1 & S2 heard, RRR. No JVD, murmurs, rubs, gallops or clicks.  Gastrointestinal system: Abdomen is obese, nondistended, soft and nontender. No organomegaly or masses felt. Normal bowel sounds heard. Central nervous system: Alert and oriented. No focal neurological deficits. Extremities: No cyanosis or clubbing; no lower extremity edema appreciated. Skin: Right inner thigh with appreciated less swelling and redness; wound is currently packed and patient reporting some tenderness on palpation. Psychiatry: Mood & affect appropriate.     Data Reviewed: I have personally reviewed following labs and imaging studies  CBC: Recent Labs  Lab 04/12/21 2330 04/13/21 0600 04/14/21 0802  WBC 11.1* 11.9* 9.4  NEUTROABS 7.3 6.8  --   HGB 14.4 11.9* 12.2  HCT 43.7 35.0* 35.6*  MCV 96.3 93.6 92.2  PLT 471* 421* 325    Basic Metabolic Panel: Recent Labs  Lab 04/12/21 2330 04/13/21 0600 04/13/21 1221 04/13/21 1605 04/14/21 0802  NA 130* 134* 136 135 140  K 4.5 3.3* 3.5 3.1* 2.9*  CL 103 110 111 110 113*  CO2 <7* 13* 14* 19* 19*  GLUCOSE 538*  185* 177* 181* 127*  BUN 30* 25* 23 22 17   CREATININE 1.27* 0.85 0.76 0.73 0.68  CALCIUM 8.7* 8.7* 8.9 8.9 9.2  MG  --  1.7  --   --  1.6*    GFR: Estimated Creatinine Clearance: 88.1 mL/min (by C-G formula based on SCr of 0.68 mg/dL).  Liver Function Tests: Recent Labs  Lab 04/12/21 2330 04/13/21 0600  AST 13* 12*  ALT 20 16  ALKPHOS 182* 142*  BILITOT 2.2* 1.1  PROT 7.8 6.3*  ALBUMIN 3.2* 2.6*    CBG: Recent Labs  Lab 04/14/21 0601 04/14/21 0736 04/14/21 0901 04/14/21 1010 04/14/21 1111  GLUCAP 233* 137* 174* 168* 168*     Recent Results (from the past 240 hour(s))  Blood Culture (routine x 2)     Status: None (Preliminary result)   Collection Time: 04/12/21 11:16 PM   Specimen: BLOOD LEFT ARM  Result Value Ref Range Status   Specimen Description BLOOD LEFT ARM  Final   Special Requests   Final    BOTTLES DRAWN AEROBIC ONLY Blood Culture adequate volume  Culture   Final    NO GROWTH 2 DAYS Performed at [facility], [address] [place], [place] [address]    Report Status PENDING  Incomplete  Blood Culture (routine x 2)     Status: None (Preliminary result)   Collection Time: 04/12/21 11:25 PM   Specimen: BLOOD LEFT HAND  Result Value Ref Range Status   Specimen Description BLOOD LEFT HAND  Final   Special Requests   Final    BOTTLES DRAWN AEROBIC ONLY Blood Culture adequate volume   Culture   Final    NO GROWTH 2 DAYS Performed at [facility], [address], [place], [place] [address]    Report Status PENDING  Incomplete  Resp Panel by RT-PCR (Flu A&B, Covid) Nasopharyngeal Swab     Status: None   Collection Time: 04/12/21 11:39 PM   Specimen: Nasopharyngeal Swab; Nasopharyngeal(NP) swabs in vial transport medium  Result Value Ref Range Status   SARS Coronavirus 2 by RT PCR NEGATIVE NEGATIVE Final    Comment: (NOTE) SARS-CoV-2 target nucleic acids are NOT DETECTED.  The SARS-CoV-2 RNA is generally detectable in upper respiratory specimens  during the acute phase of infection. The lowest concentration of SARS-CoV-2 viral copies this assay can detect is 138 copies/mL. A negative result does not preclude SARS-Cov-2 infection and should not be used as the sole basis for treatment or other patient management decisions. A negative result may occur with  improper specimen collection/handling, submission of specimen other than nasopharyngeal swab, presence of viral mutation(s) within the areas targeted by this assay, and inadequate number of viral copies(<138 copies/mL). A negative result must be combined with clinical observations, patient history, and epidemiological information. The expected result is Negative.  Fact Sheet for Patients:  EntrepreneurPulse.com.au  Fact Sheet for Healthcare Providers:  IncredibleEmployment.be  This test is no t yet approved or cleared by the [place] FDA and  has been authorized for detection and/or diagnosis of SARS-CoV-2 by FDA under an Emergency Use Authorization (EUA). This EUA will remain  in effect (meaning this test can be used) for the duration of the COVID-19 declaration under Section 564(b)(1) of the Act, 21 U.S.C.section 360bbb-3(b)(1), unless the authorization is terminated  or revoked sooner.       Influenza A by PCR NEGATIVE NEGATIVE Final   Influenza B by PCR NEGATIVE NEGATIVE Final    Comment: (NOTE) The Xpert Xpress SARS-CoV-2/FLU/RSV plus assay is intended as an aid in the diagnosis of influenza from Nasopharyngeal swab specimens and should not be used as a sole basis for treatment. Nasal washings and aspirates are unacceptable for Xpert Xpress SARS-CoV-2/FLU/RSV testing.  Fact Sheet for Patients: EntrepreneurPulse.com.au  Fact Sheet for Healthcare Providers: IncredibleEmployment.be  This test is not yet approved or cleared by the [place] FDA and has been authorized for detection  and/or diagnosis of SARS-CoV-2 by FDA under an Emergency Use Authorization (EUA). This EUA will remain in effect (meaning this test can be used) for the duration of the COVID-19 declaration under Section 564(b)(1) of the Act, 21 U.S.C. section 360bbb-3(b)(1), unless the authorization is terminated or revoked.  Performed at [facility], [address] [place],  [address]   MRSA PCR Screening     Status: None   Collection Time: 04/13/21  8:42 AM   Specimen: Nasal Mucosa; Nasopharyngeal  Result Value Ref Range Status   MRSA by PCR NEGATIVE NEGATIVE Final    Comment:        The GeneXpert MRSA Assay (FDA  approved for NASAL specimens only), is one component of a comprehensive MRSA colonization surveillance program. It is not intended to diagnose MRSA infection nor to guide or monitor treatment for MRSA infections. Performed at [facility], [address] [place], [place] [address]   Aerobic Culture w Gram Stain (superficial specimen)     Status: None (Preliminary result)   Collection Time: 04/13/21  3:10 PM   Specimen: Thigh; Wound  Result Value Ref Range Status   Specimen Description   Final    THIGH Performed at [facility], [address] [place], [place] [address]    Special Requests   Final    NONE Performed at [facility], [address] [place], [place] [address]    Gram Stain PENDING  Incomplete   Culture   Final    TOO YOUNG TO READ Performed at [facility], [place] [address] [place], [place] [address]    Report Status PENDING  Incomplete    Radiology Studies: CT FEMUR RIGHT WO CONTRAST  Result Date: 04/12/2021 CLINICAL DATA:  60 year old female with concern for soft tissue infection. EXAM: CT OF THE LOWER RIGHT EXTREMITY WITHOUT CONTRAST TECHNIQUE: Multidetector CT imaging of the right lower extremity was performed according to the standard protocol. COMPARISON:  None. FINDINGS: Bones/Joint/Cartilage There is no acute fracture or dislocation. Mild  arthritic changes of the right knee. Ligaments Suboptimally assessed by CT. Muscles and Tendons No acute findings. No fluid collection or intramuscular hematoma. Soft tissues There is inflammatory changes with small complex collection containing pockets of air in the subcutaneous soft tissues of the medial upper right thigh. There is apparent focal area of skin defect. Findings may represent cellulitis or an infectious etiology. The soft tissue gas may have been introduced via incision and drainage. Soft tissue air related to gangrene or necrotizing fasciitis is not excluded. Correlation with history of instrumentation recommended. No drainable fluid collection present. IMPRESSION: 1. Inflammatory changes with small complex collection containing pockets of air in the subcutaneous soft tissues of the medial upper right thigh. Findings may represent cellulitis or an infectious etiology. Soft tissue air may have been introduced during incision and drainage. Gangrene or necrotizing fasciitis is not excluded. Correlation with history of instrumentation recommended. No drainable fluid collection. 2. No acute fracture or dislocation. Electronically Signed   By: Anner Crete M.D.   On: 04/12/2021 23:18   DG Chest Port 1 View  Result Date: 04/13/2021 CLINICAL DATA:  Possible sepsis EXAM: PORTABLE CHEST 1 VIEW COMPARISON:  06/10/2010 FINDINGS: The heart size and mediastinal contours are within normal limits. Both lungs are clear. The visualized skeletal structures are unremarkable. IMPRESSION: No active disease. Electronically Signed   By: Donavan Foil M.D.   On: 04/13/2021 00:29    Scheduled Meds: . Chlorhexidine Gluconate Cloth  6 each Topical Q0600  . gabapentin  800 mg Oral TID  . heparin  5,000 Units Subcutaneous Q8H  . insulin aspart  0-15 Units Subcutaneous TID WC  . insulin aspart  0-5 Units Subcutaneous QHS  . insulin detemir  10 Units Subcutaneous BID  . potassium chloride  40 mEq Oral Q4H  .  rosuvastatin  20 mg Oral Daily   Continuous Infusions: . lactated ringers Stopped (04/13/21 0455)  . magnesium sulfate bolus IVPB    . vancomycin Stopped (04/14/21 0903)     LOS: 1 day    Time spent: 35 minutes   Barton Dubois, MD [facility]   To contact the attending provider between  7A-7P or the covering provider during after hours 7P-7A, please log into the web site [email or web address] and access using universal [place] password for that web site. If you do not have the password, please call the hospital operator.  04/14/2021, 11:41 AM

## 2021-04-14 NOTE — Progress Notes (Signed)
Blood sugar 242, 5 units given L arm.

## 2021-04-14 NOTE — Progress Notes (Signed)
Full bath and linen change with NT. Wound care completed at 1000.

## 2021-04-15 LAB — GLUCOSE, CAPILLARY
Glucose-Capillary: 264 mg/dL — ABNORMAL HIGH (ref 70–99)
Glucose-Capillary: 242 mg/dL — ABNORMAL HIGH (ref 70–99)
Glucose-Capillary: 257 mg/dL — ABNORMAL HIGH (ref 70–99)
Glucose-Capillary: 238 mg/dL — ABNORMAL HIGH (ref 70–99)
Glucose-Capillary: 324 mg/dL — ABNORMAL HIGH (ref 70–99)
Glucose-Capillary: 300 mg/dL — ABNORMAL HIGH (ref 70–99)
Glucose-Capillary: 347 mg/dL — ABNORMAL HIGH (ref 70–99)

## 2021-04-15 LAB — BASIC METABOLIC PANEL
Anion gap: 6 (ref 5–15)
BUN: 13 mg/dL (ref 8–23)
CO2: 23 mmol/L (ref 22–32)
Calcium: 8.5 mg/dL — ABNORMAL LOW (ref 8.9–10.3)
Chloride: 108 mmol/L (ref 98–111)
Creatinine, Ser: 0.51 mg/dL (ref 0.44–1.00)
GFR, Estimated: >60 mL/min (ref >60)
Glucose, Bld: 273 mg/dL — ABNORMAL HIGH (ref 70–99)
Potassium: 3.7 mmol/L (ref 3.5–5.1)
Sodium: 137 mmol/L (ref 135–145)

## 2021-04-15 MED ORDER — INSULIN STARTER KIT- PEN NEEDLES (ENGLISH)
1.0000 | Freq: Once | Status: AC
  Administered 2021-04-15: 1
  Filled 2021-04-15: qty 1

## 2021-04-15 MED ORDER — LIDOCAINE 4 % EX CREA
TOPICAL_CREAM | Freq: Once | CUTANEOUS | Status: DC
  Filled 2021-04-15: qty 5

## 2021-04-15 MED ORDER — SODIUM CHLORIDE 0.9 % IV SOLN
1.0000 g | INTRAVENOUS | Status: DC
  Administered 2021-04-15 – 2021-04-16 (×2): 1 g via INTRAVENOUS
  Filled 2021-04-15 (×2): qty 10

## 2021-04-15 MED ORDER — LIVING WELL WITH DIABETES BOOK: Freq: Once | Status: AC

## 2021-04-15 NOTE — Progress Notes (Signed)
PROGRESS NOTE    Misty Hendrix  ZRA:[id number] DOB: [birth year] DOA: 04/12/2021 PCP: Scotty Court, DO   Chief Complaint  Patient presents with  . Altered Mental Status    Brief admission Narrative:  Misty Hendrix  is a 60 y.o. female, with history of type 2 diabetes mellitus, obstructive sleep apnea, mixed hyperlipidemia, GERD, essential hypertension, and more presents the ED with a chief complaint of fatigue.  Unfortunately patient herself is not able to provide any history.  She is oriented to person place and time, but then on further questioning just continues to repeat the year, not answering other questions.  When she finally is able to stop perseverating on the year, she reports she does not know why she is here and her daughter told her she had to come.  Apparently when she first came in with EMS she was IVC'ed, but that paperwork was rescinded as patient was not thought to be a risk of harm to self.  ED provider reports that family had complained of progressive fatigue, and that family was concerned patient was not taking care of herself properly at home.  Patient is not able to tell me her medications or what her blood sugars have been running so it is quite possible that she is not able to take care of herself at home.  Patient reports that she is in no pain at the time of my exam.  She reports intermittent RLE pain and chills.  Assessment & Plan: 1-sepsis due to right inner thigh abscess -Patient met sepsis criteria on admission with specific source of infection identified (cellulitis/abscess process), elevated WBCs, tachycardia and tachypnea. -Sepsis pathophysiology resolving at this time -Okay to stop IV vancomycin okay to use IV Rocephin instead -I&D/wound culture NGTD  2-DKA (diabetic ketoacidosis) ([place]) -In the setting of medication noncompliance -A1c more than 15.5; demonstrating very poor control. -Anion gap is closed, bicarb above 19 (at target for transitioning off  insulin drip); CBGs with a slight fluctuation but for the most part below 200 for more than 3 occasions -Continue Levemir twice a day along with sliding scale insulin -Continue to closely follow CBGs and continue to maintain adequate hydration  3) E. coli UTI--- trial from 04/13/2021 noted, okay to treat empirically with IV Rocephin  4)AKI (acute kidney injury) ([place]) -In the setting of prerenal azotemia, dehydration and acute infection/sepsis -Creatinine was elevated at 1.27 on presentation -Appears to have resolved with hydration  5-hyperlipidemia -Continue statins  6-hypokalemia -In the setting of insulin therapy for DKA process and decreased oral intake -Electrolytes will be repleted -Will check magnesium level and follow potassium trend for instability.  7-Acute metabolic encephalopathy -present at time of admission Mostly resolved with resolution of DKA and infection -currently oriented X 3.  DVT prophylaxis: Heparin Code Status: Full code Family Communication: No family at bedside. Disposition:   Status is: Inpatient  Dispo: The patient is from: Home              Anticipated d/c is to: Home 04/16/2021              Patient currently no medically stable for discharge yet; final culture results and sensitivity from right inner thigh abscess are pending; patient transitioning off insulin drip and is still demonstrating fluctuating CBGs.   Difficult to place patient No     Consultants:   General surgery   Procedures:  See below for x-ray reports Right inner thigh I&D 04/13/2021   Antimicrobials: Vancomycin  Subjective: -Concerns about urinary frequency, no fevers, no emesis right upper thigh wound without significant drainage, dressing intact  Objective: Vitals:   04/14/21 1950 04/15/21 0010 04/15/21 0446 04/15/21 1354  BP: 105/63 140/73 122/69 124/72  Pulse: 90 91 87 86  Resp: 18 18 18 18   Temp: 98.6 F (37 C) 99.1 F (37.3 C) 98.2 F (36.8 C) 98.6 F  (37 C)  TempSrc: Oral Oral  Oral  SpO2: 99% 100% 96% 97%  Weight:      Height:        Intake/Output Summary (Last 24 hours) at 04/15/2021 1824 Last data filed at 04/15/2021 1754 Gross per 24 hour  Intake 2510.14 ml  Output 1450 ml  Net 1060.14 ml   Filed Weights   04/13/21 0848  Weight: 100 kg    Examination:  General exam: Appears calm and comfortable; following commands appropriately during evaluation patient was oriented x3.  No fever.  Reports no chest pain, shortness of breath, nausea or vomiting.  Elicited some pain in her right inner thigh after I&D. Respiratory system: Good air movement bilaterally; no wheezing, no crackles, no use of accessory muscles appreciated. Cardiovascular system: S1 & S2 heard, RRR. No JVD, murmurs, rubs, gallops or clicks.  Gastrointestinal system: Abdomen is obese, nondistended, soft and nontender.   Normal bowel sounds heard.  No CVA area tenderness Central nervous system: Alert and oriented. No focal neurological deficits. Extremities: No cyanosis or clubbing; no lower extremity edema appreciated. Skin: Right inner thigh with appreciated less swelling and redness; wound is currently packed and patient reporting some tenderness on palpation. Psychiatry: Mood & affect appropriate.     Data Reviewed: I have personally reviewed following labs and imaging studies  CBC: Recent Labs  Lab 04/12/21 2330 04/13/21 0600 04/14/21 0802  WBC 11.1* 11.9* 9.4  NEUTROABS 7.3 6.8  --   HGB 14.4 11.9* 12.2  HCT 43.7 35.0* 35.6*  MCV 96.3 93.6 92.2  PLT 471* 421* 811    Basic Metabolic Panel: Recent Labs  Lab 04/13/21 0600 04/13/21 1221 04/13/21 1605 04/14/21 0802 04/15/21 0529  NA 134* 136 135 140 137  K 3.3* 3.5 3.1* 2.9* 3.7  CL 110 111 110 113* 108  CO2 13* 14* 19* 19* 23  GLUCOSE 185* 177* 181* 127* 273*  BUN 25* 23 22 17 13   CREATININE 0.85 0.76 0.73 0.68 0.51  CALCIUM 8.7* 8.9 8.9 9.2 8.5*  MG 1.7  --   --  1.6*  --      GFR: Estimated Creatinine Clearance: 88.1 mL/min (by C-G formula based on SCr of 0.51 mg/dL).  Liver Function Tests: Recent Labs  Lab 04/12/21 2330 04/13/21 0600  AST 13* 12*  ALT 20 16  ALKPHOS 182* 142*  BILITOT 2.2* 1.1  PROT 7.8 6.3*  ALBUMIN 3.2* 2.6*    CBG: Recent Labs  Lab 04/14/21 1510 04/14/21 2138 04/15/21 0901 04/15/21 1059 04/15/21 1706  GLUCAP 251* 260* 238* 324* 300*     Recent Results (from the past 240 hour(s))  Blood Culture (routine x 2)     Status: None (Preliminary result)   Collection Time: 04/12/21 11:16 PM   Specimen: BLOOD LEFT ARM  Result Value Ref Range Status   Specimen Description BLOOD LEFT ARM  Final   Special Requests   Final    BOTTLES DRAWN AEROBIC ONLY Blood Culture adequate volume   Culture   Final    NO GROWTH 3 DAYS Performed at [facility], [address] [place],  [place] [address]    Report Status PENDING  Incomplete  Blood Culture (routine x 2)     Status: None (Preliminary result)   Collection Time: 04/12/21 11:25 PM   Specimen: BLOOD LEFT HAND  Result Value Ref Range Status   Specimen Description BLOOD LEFT HAND  Final   Special Requests   Final    BOTTLES DRAWN AEROBIC ONLY Blood Culture adequate volume   Culture   Final    NO GROWTH 3 DAYS Performed at [facility], [address], [place], [place] [address]    Report Status PENDING  Incomplete  Resp Panel by RT-PCR (Flu A&B, Covid) Nasopharyngeal Swab     Status: None   Collection Time: 04/12/21 11:39 PM   Specimen: Nasopharyngeal Swab; Nasopharyngeal(NP) swabs in vial transport medium  Result Value Ref Range Status   SARS Coronavirus 2 by RT PCR NEGATIVE NEGATIVE Final    Comment: (NOTE) SARS-CoV-2 target nucleic acids are NOT DETECTED.  The SARS-CoV-2 RNA is generally detectable in upper respiratory specimens during the acute phase of infection. The lowest concentration of SARS-CoV-2 viral copies this assay can detect is 138 copies/mL. A negative  result does not preclude SARS-Cov-2 infection and should not be used as the sole basis for treatment or other patient management decisions. A negative result may occur with  improper specimen collection/handling, submission of specimen other than nasopharyngeal swab, presence of viral mutation(s) within the areas targeted by this assay, and inadequate number of viral copies(<138 copies/mL). A negative result must be combined with clinical observations, patient history, and epidemiological information. The expected result is Negative.  Fact Sheet for Patients:  EntrepreneurPulse.com.au  Fact Sheet for Healthcare Providers:  IncredibleEmployment.be  This test is no t yet approved or cleared by the [place] FDA and  has been authorized for detection and/or diagnosis of SARS-CoV-2 by FDA under an Emergency Use Authorization (EUA). This EUA will remain  in effect (meaning this test can be used) for the duration of the COVID-19 declaration under Section 564(b)(1) of the Act, 21 U.S.C.section 360bbb-3(b)(1), unless the authorization is terminated  or revoked sooner.       Influenza A by PCR NEGATIVE NEGATIVE Final   Influenza B by PCR NEGATIVE NEGATIVE Final    Comment: (NOTE) The Xpert Xpress SARS-CoV-2/FLU/RSV plus assay is intended as an aid in the diagnosis of influenza from Nasopharyngeal swab specimens and should not be used as a sole basis for treatment. Nasal washings and aspirates are unacceptable for Xpert Xpress SARS-CoV-2/FLU/RSV testing.  Fact Sheet for Patients: EntrepreneurPulse.com.au  Fact Sheet for Healthcare Providers: IncredibleEmployment.be  This test is not yet approved or cleared by the [place] FDA and has been authorized for detection and/or diagnosis of SARS-CoV-2 by FDA under an Emergency Use Authorization (EUA). This EUA will remain in effect (meaning this test can be used)  for the duration of the COVID-19 declaration under Section 564(b)(1) of the Act, 21 U.S.C. section 360bbb-3(b)(1), unless the authorization is terminated or revoked.  Performed at [facility], [address], [place], [place] [address]   Urine culture     Status: Abnormal (Preliminary result)   Collection Time: 04/13/21  2:30 AM   Specimen: In/Out Cath Urine  Result Value Ref Range Status   Specimen Description   Final    IN/OUT CATH URINE Performed at [facility], [address], [place], [place] [address]    Special Requests   Final    NONE Performed at [facility], [place]  [address] [place], [place] [address]    Culture (A)  Final    >=100,000 COLONIES/mL ESCHERICHIA COLI CULTURE REINCUBATED FOR BETTER GROWTH Performed at [facility], [place] [address] [place], [place] [address]    Report Status PENDING  Incomplete   Organism ID, Bacteria ESCHERICHIA COLI (A)  Final      Susceptibility   Escherichia coli - MIC*    AMPICILLIN <=2 SENSITIVE Sensitive     CEFAZOLIN <=4 SENSITIVE Sensitive     CEFEPIME <=0.12 SENSITIVE Sensitive     CEFTRIAXONE <=0.25 SENSITIVE Sensitive     CIPROFLOXACIN <=0.25 SENSITIVE Sensitive     GENTAMICIN <=1 SENSITIVE Sensitive     IMIPENEM <=0.25 SENSITIVE Sensitive     NITROFURANTOIN <=16 SENSITIVE Sensitive     TRIMETH/SULFA <=20 SENSITIVE Sensitive     AMPICILLIN/SULBACTAM <=2 SENSITIVE Sensitive     PIP/TAZO <=4 SENSITIVE Sensitive     * >=100,000 COLONIES/mL ESCHERICHIA COLI  MRSA PCR Screening     Status: None   Collection Time: 04/13/21  8:42 AM   Specimen: Nasal Mucosa; Nasopharyngeal  Result Value Ref Range Status   MRSA by PCR NEGATIVE NEGATIVE Final    Comment:        The GeneXpert MRSA Assay (FDA approved for NASAL specimens only), is one component of a comprehensive MRSA colonization surveillance program. It is not intended to diagnose MRSA infection nor to guide or monitor treatment for MRSA infections. Performed at  [facility], [address], [place], [place] [address]   Aerobic Culture w Gram Stain (superficial specimen)     Status: None (Preliminary result)   Collection Time: 04/13/21  3:10 PM   Specimen: Thigh; Wound  Result Value Ref Range Status   Specimen Description   Final    THIGH Performed at [facility], [address], [place], [place] [address]    Special Requests   Final    NONE Performed at [facility], [address] [place], [place] [address]    Gram Stain NO WBC SEEN NO ORGANISMS SEEN   Final   Culture   Final    FEW KLEBSIELLA PNEUMONIAE SUSCEPTIBILITIES TO FOLLOW Performed at [facility], [place] [address] [place], [place] [address]    Report Status PENDING  Incomplete    Radiology Studies: No results found.  Scheduled Meds: . Chlorhexidine Gluconate Cloth  6 each Topical Q0600  . gabapentin  800 mg Oral TID  . heparin  5,000 Units Subcutaneous Q8H  . insulin aspart  0-15 Units Subcutaneous TID WC  . insulin aspart  0-5 Units Subcutaneous QHS  . insulin detemir  10 Units Subcutaneous BID  . lidocaine   Topical Once  . rosuvastatin  20 mg Oral Daily   Continuous Infusions: . cefTRIAXone (ROCEPHIN)  IV 1 g (04/15/21 0905)  . lactated ringers Stopped (04/15/21 1027)     LOS: 2 days     Roxan Hockey, MD [facility]   To contact the attending provider between 7A-7P or the covering provider during after hours 7P-7A, please log into the web site [email or web address] and access using universal [place] password for that web site. If you do not have the password, please call the hospital operator.  04/15/2021, 6:24 PM

## 2021-04-15 NOTE — Progress Notes (Signed)
Patient given instructions on checking blood glucose and giving insulin and demonstrated understanding in both. Also demonstrated how to use insulin pen for anticipated discharge.

## 2021-04-15 NOTE — Progress Notes (Signed)
Wound care complete. Patient tolerated okay. Oxy given beforehand.

## 2021-04-15 NOTE — Progress Notes (Signed)
[facility]  Dressing changed and wounds looks good. Continue Bid to daily packing with saline dampened gauze. Can follow up with PCP.      Algis Greenhouse, MD [facility] [address] [place], [place] [address] [phone] (office)

## 2021-04-16 DIAGNOSIS — B9620 Unspecified Escherichia coli [E. coli] as the cause of diseases classified elsewhere: Secondary | ICD-10-CM

## 2021-04-16 DIAGNOSIS — R6510 Systemic inflammatory response syndrome (SIRS) of non-infectious origin without acute organ dysfunction: Secondary | ICD-10-CM

## 2021-04-16 DIAGNOSIS — N390 Urinary tract infection, site not specified: Secondary | ICD-10-CM

## 2021-04-16 LAB — AEROBIC CULTURE W GRAM STAIN (SUPERFICIAL SPECIMEN): Gram Stain: NONE SEEN

## 2021-04-16 LAB — GLUCOSE, CAPILLARY
Glucose-Capillary: 242 mg/dL — ABNORMAL HIGH (ref 70–99)
Glucose-Capillary: 264 mg/dL — ABNORMAL HIGH (ref 70–99)

## 2021-04-16 LAB — URINE CULTURE: Culture: >=100000 — AB

## 2021-04-16 MED ORDER — LEVEMIR FLEXTOUCH 100 UNIT/ML ~~LOC~~ SOPN: 12.0000 [IU] | PEN_INJECTOR | Freq: Two times a day (BID) | SUBCUTANEOUS | 11 refills | Status: AC

## 2021-04-16 MED ORDER — ROSUVASTATIN CALCIUM 20 MG PO TABS: 20.0000 mg | ORAL_TABLET | Freq: Every day | ORAL | 3 refills | Status: AC

## 2021-04-16 MED ORDER — ONDANSETRON 4 MG PO TBDP: 4.0000 mg | ORAL_TABLET | Freq: Three times a day (TID) | ORAL | 0 refills | Status: AC | PRN

## 2021-04-16 MED ORDER — "PEN NEEDLES 3/16"" 31G X 5 MM MISC": 1.0000 [IU] | Freq: Three times a day (TID) | 1 refills | Status: AC

## 2021-04-16 MED ORDER — METFORMIN HCL 1000 MG PO TABS: 1000.0000 mg | ORAL_TABLET | Freq: Two times a day (BID) | ORAL | 3 refills | Status: AC

## 2021-04-16 MED ORDER — BLOOD GLUCOSE METER KIT: PACK | 3 refills | Status: AC

## 2021-04-16 MED ORDER — TRAZODONE HCL 100 MG PO TABS: 100.0000 mg | ORAL_TABLET | Freq: Every day | ORAL | 2 refills | Status: AC

## 2021-04-16 MED ORDER — ACETAMINOPHEN 325 MG PO TABS: 650.0000 mg | ORAL_TABLET | Freq: Four times a day (QID) | ORAL | 0 refills | Status: AC | PRN

## 2021-04-16 MED ORDER — ALBUTEROL SULFATE HFA 108 (90 BASE) MCG/ACT IN AERS: 2.0000 | INHALATION_SPRAY | Freq: Four times a day (QID) | RESPIRATORY_TRACT | 1 refills | Status: AC | PRN

## 2021-04-16 MED ORDER — CEPHALEXIN 500 MG PO CAPS: 500.0000 mg | ORAL_CAPSULE | Freq: Three times a day (TID) | ORAL | 0 refills | Status: AC

## 2021-04-16 MED ORDER — INSULIN ASPART 100 UNIT/ML FLEXPEN: 0.0000 [IU] | PEN_INJECTOR | Freq: Three times a day (TID) | SUBCUTANEOUS | 11 refills | Status: AC

## 2021-04-16 NOTE — Discharge Instructions (Signed)
1)Avoid ibuprofen/Advil/Aleve/Motrin/Goody Powders/Naproxen/BC powders/Meloxicam/Diclofenac/Indomethacin and other Nonsteroidal anti-inflammatory medications as these will make you more likely to bleed and can cause stomach ulcers, can also cause Kidney problems.   2) Levemir and NovoLog insulin as advised  3)Pack right thigh incision wound with saline dampened kerlix/gauze, cover with abd and paper tape each day   4)follow up with Hart Robinsons, DO in about 1 week for recheck and reevaluation
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
## 2021-04-16 NOTE — Progress Notes (Signed)
Inpatient Diabetes Program Recommendations  AACE/A[place]lee: New Consensus Statement on Inpatient Glycemic Control (2015)  Target Ranges:  Prepandial:   less than 140 mg/dL      Peak postprandial:   less than 180 mg/dL (1-2 hours)      Critically ill patients:  140 - 180 mg/dL   Lab Results  Component Value Date   GLUCAP 264 (H) 04/16/2021   HGBA1C >15.5 (H) 04/13/2021    Review of Glycemic Control  Diabetes history: DM2 Outpatient Diabetes medications: None Current orders for Inpatient glycemic control: Levemir 10 units BID, Novolog 0-15 units TID with meals and 0-5 HS  HgbA1C - > 15.5%  Inpatient Diabetes Program Recommendations:     Increase Levemir to 12 units BID Add meal coverage insulin - 4 units TID with meals if eating > 50%   Spoke with patient about new diabetes diagnosis.  Discussed A1C results (>15.5%) and explained what an A1C is and informed patient that his current A1C indicates an average glucose of 400 mg/dl over the past 2-3 months. Discussed basic pathophysiology of DM Type 2, basic home care, importance of checking CBGs and maintaining good CBG control to prevent long-term and short-term complications. Reviewed glucose and A1C goals and explained that patient will need to continue checking blood sugars Reviewed signs and symptoms of hyperglycemia and hypoglycemia along with treatment for both. Discussed impact of nutrition, exercise, stress, sickness, and medications on diabetes control. Ordered Living Well with diabetes booklet and encouraged patient to read through entire book.  Pt states she feels comfortable giving her own insulin and checking her blood sugars at home. Does not understand her Medicaid status and wants to speak with someone about it. Ordered TOC consult.  Continue to follow.  Thank you. Ailene Ards, RD, LDN, CDE Inpatient Diabetes Coordinator [phone]

## 2021-04-16 NOTE — Discharge Summary (Signed)
Misty Hendrix, is a 60 y.o. female  DOB November 26, 1960  MRN [id number].  Admission date:  04/12/2021  Admitting Physician  Rolla Plate, DO  Discharge Date:  04/16/2021   Primary MD  Scotty Court, DO  Recommendations for primary care physician for things to follow:   1)Avoid ibuprofen/Advil/Aleve/Motrin/Goody Powders/Naproxen/BC powders/Meloxicam/Diclofenac/Indomethacin and other Nonsteroidal anti-inflammatory medications as these will make you more likely to bleed and can cause stomach ulcers, can also cause Kidney problems.   2) Levemir and NovoLog insulin as advised  3)Pack right thigh incision wound with saline dampened kerlix/gauze, cover with abd and paper tape each day   4)follow up with Scotty Court, DO in about 1 week for recheck and reevaluation   Admission Diagnosis  DKA (diabetic ketoacidosis) ([place]) [E11.10] Diabetic ketoacidosis without coma associated with diabetes mellitus due to underlying condition ([place]) [E08.10] Sepsis with encephalopathy without septic shock, due to unspecified organism ([place]) [A41.9, R65.20, G93.40]   Discharge Diagnosis  DKA (diabetic ketoacidosis) ([place]) [E11.10] Diabetic ketoacidosis without coma associated with diabetes mellitus due to underlying condition ([place]) [E08.10] Sepsis with encephalopathy without septic shock, due to unspecified organism ([place]) [A41.9, R65.20, G93.40]    Principal Problem:   DKA (diabetic ketoacidosis) ([place]) Active Problems:   Rt Upper Thigh Abscess   SIRS (systemic inflammatory response syndrome) ([place])   E. coli UTI   Mild protein-calorie malnutrition ([place])   AKI (acute kidney injury) ([place])      Past Medical History:  Diagnosis Date  . Anxiety   . Asthma    Dr. Melvyn Novas - h/o cough, better off ACE-I  . Back pain, chronic   . Depression   . Diabetes mellitus   . Essential hypertension, benign   . GERD  (gastroesophageal reflux disease)   . Lumbar radiculopathy   . Mixed hyperlipidemia   . Obesity   . Obstructive sleep apnea    CPAP-hasn" used since 2015  . Type 2 diabetes mellitus ([place])     Past Surgical History:  Procedure Laterality Date  . CESAREAN SECTION  1993  . CHOLECYSTECTOMY N/A 04/29/2016   Procedure: LAPAROSCOPIC CHOLECYSTECTOMY;  Surgeon: Arta Bruce Kinsinger, MD;  Location: [facility];  Service: General;  Laterality: N/A;       HPI  from the history and physical done on the day of admission:    Misty Hendrix  is a 60 y.o. female, with history of type 2 diabetes mellitus, obstructive sleep apnea, mixed hyperlipidemia, GERD, essential hypertension, and more presents the ED with a chief complaint of fatigue.  Unfortunately patient herself is not able to provide any history.  She is oriented to person place and time, but then on further questioning just continues to repeat the year, not answering other questions.  When she finally is able to stop perseverating on the year, she reports she does not know why she is here and her daughter told her she had to come.  Apparently when she first came in with EMS she was IVC'ed, but that  paperwork was rescinded as patient was not thought to be a risk of harm to self.  ED provider reports that family had complained of progressive fatigue, and that family was concerned patient was not taking care of herself properly at home.  Patient is not able to tell me her medications or what her blood sugars have been running so it is quite possible that she is not able to take care of herself at home.  Patient reports that she is in no pain at the time of my exam.  She reports that somebody told her her right leg hurts, but she does not feel a pain now.  In the ED Temp 97.7, heart rate 115-122, respiratory rate 17-26, blood pressure 132/53, satting 100% No leukocytosis with a white blood cell count of 11.1, hemoglobin 14.4 Chemistry panel  reveals a pseudohyponatremia 130, corrects for glucose 538 Bicarb is less than 7 Creatinine is 1.27 up from baseline 0.8 Glucose is 538 pH is 7.1 Lactic acid is normal Respiratory panel is negative Blood cultures pending Chest x-ray shows no active disease  Vancomycin and Zosyn started in the ED 2 L bolus given in the ED Admission requested for further management of DKA cellulitis/abscess    Hospital Course:      Assessment & Plan: 1) sepsis due to right inner thigh Klebsiella abscess and E. coli UTI -Patient met sepsis criteria on admission with specific source of infection identified (cellulitis/abscess process), elevated WBCs, tachycardia and tachypnea. -Sepsis pathophysiology resolving at this time -Okay to stop IV vancomycin okay to use IV Rocephin instead -I&D/wound culture with Klebsiella sensitive to Rocephin and Keflex -Patient was treated with IV Rocephin okay to discharge on p.o. Keflex -  2-DKA (diabetic ketoacidosis) (H[facility]) -In the setting of medication noncompliance -A1c more than 15.5; demonstrating very poor control -Patient with uncontrolled DM with hyperglycemia due to noncompliance -Anion gap is closed, bicarb normalized -Discharge on Levemir 12 units twice a day along with sliding scale insulin  3) E. coli UTI---  urine culture from 04/13/2021 with pansensitive E. coli treated with IV Rocephin okay to discharge on p.o. Keflex ( sepsis due to right inner thigh Klebsiella abscess and E. coli UTI)  4)AKI (acute kidney injury) ([place]) -In the setting of prerenal azotemia, dehydration and acute infection/sepsis -Creatinine was elevated at 1.27 on presentation -Appears to have resolved with hydration -Creatinine is down to 0.51  5-hyperlipidemia -Continue statins  6-hypokalemia -In the setting of insulin therapy for DKA process and decreased oral intake -Resolved after replacement  7-Acute metabolic encephalopathy -present at time of  admission resolved with resolution of DKA and infection -currently oriented X 3.  8)Social/Mental Health--- patient's daughter has concerns that patient mental health problems may be impacting her compliance with medical treatment and medications -Patient apparently has episodes of psychosis from time to time -Patient denies suicidal ideation or homicidal ideation -She does not appear to be at risk for self-harm or harm toward others -Patient's daughter advised to follow-up with [facility] and also gave patient a PCP to help manage a underlying psychiatric disorders  Code Status: Full code Family Communication: Discussed with daughter and grandson Disposition:   Dispo: The patient is from: Home  Anticipated d/c is to: Home 04/16/2021     Consultants:   General surgery   Procedures:  See below for x-ray reports Right inner thigh I&D 04/13/2021   Antimicrobials: Vancomycin -Rocephin  Discharge Condition: Stable  Follow UP   Follow-up Information    Scotty Court,  DO. Schedule an appointment as soon as possible for a visit in 1 week(s).   Contact information: [address] [place] [address] [phone]               Diet and Activity recommendation:  As advised  Discharge Instructions    Discharge Instructions    Call MD for:  difficulty breathing, headache or visual disturbances   Complete by: As directed    Call MD for:  persistant dizziness or light-headedness   Complete by: As directed    Call MD for:  persistant nausea and vomiting   Complete by: As directed    Call MD for:  redness, tenderness, or signs of infection (pain, swelling, redness, odor or green/yellow discharge around incision site)   Complete by: As directed    Call MD for:  severe uncontrolled pain   Complete by: As directed    Call MD for:  temperature >100.4   Complete by: As directed    Diet Carb Modified   Complete by: As directed    Diabetic  diet advised   Discharge instructions   Complete by: As directed    1)Avoid ibuprofen/Advil/Aleve/Motrin/Goody Powders/Naproxen/BC powders/Meloxicam/Diclofenac/Indomethacin and other Nonsteroidal anti-inflammatory medications as these will make you more likely to bleed and can cause stomach ulcers, can also cause Kidney problems.   2)Levemir and NovoLog insulin as advised  3)Pack Right thigh incision wound with saline dampened Kerlix/Gauze, cover with abd and paper tape each day   4)Follow up with Scotty Court, DO in about 1 week for recheck and reevaluation  5)Follow up with Psychiatrist as outpatient within 1 week---   Discharge wound care:   Complete by: As directed    Pack right thigh incision with saline dampened kerlix, cover with abd and paper tape each day   Increase activity slowly   Complete by: As directed         Discharge Medications     Allergies as of 04/16/2021      Reactions   Olmesartan Rash   Lisinopril Cough   Benicar [olmesartan Medoxomil] Rash   Dextromethorphan-guaifenesin Nausea And Vomiting      Medication List    STOP taking these medications   ALPRAZolam 0.5 MG tablet Commonly known as: XANAX   dicyclomine 20 MG tablet Commonly known as: Bentyl   gabapentin 800 MG tablet Commonly known as: NEURONTIN     TAKE these medications   acetaminophen 325 MG tablet Commonly known as: TYLENOL Take 2 tablets (650 mg total) by mouth every 6 (six) hours as needed for mild pain, fever or headache (or Fever >/= 101).   albuterol 108 (90 Base) MCG/ACT inhaler Commonly known as: VENTOLIN HFA Inhale 2 puffs into the lungs every 6 (six) hours as needed for wheezing or shortness of breath.   blood glucose meter kit and supplies Relion Prime or Dispense other brand based on patient and insurance preference. Use up to four times daily as directed. (FOR ICD-9 250.00, 250.01).   cephALEXin 500 MG capsule Commonly known as: Keflex Take 1 capsule (500 mg  total) by mouth 3 (three) times daily for 5 days.   insulin aspart 100 UNIT/ML FlexPen Commonly known as: NOVOLOG Inject 0-10 Units into the skin 3 (three) times daily with meals. insulin aspart (novoLOG) injection 0-10 Units 0-10 Units Subcutaneous, 3 times daily with meals CBG < 70: Implement Hypoglycemia Standing Orders and refer to Hypoglycemia Standing Orders sidebar report  CBG 70 - 120: 0 unit CBG  121 - 150: 0 unit  CBG 151 - 200: 1 unit CBG 201 - 250: 2 units CBG 251 - 300: 4 units CBG 301 - 350: 6 units  CBG 351 - 400: 8 units  CBG > 400: 10 units   Levemir FlexTouch 100 UNIT/ML FlexPen Generic drug: insulin detemir Inject 12 Units into the skin 2 (two) times daily.   metFORMIN 1000 MG tablet Commonly known as: Glucophage Take 1 tablet (1,000 mg total) by mouth 2 (two) times daily with a meal.   ondansetron 4 MG disintegrating tablet Commonly known as: Zofran ODT Take 1 tablet (4 mg total) by mouth every 8 (eight) hours as needed for nausea or vomiting.   Pen Needles 3/16" 31G X 5 MM Misc 1 Units by Does not apply route 4 (four) times daily -  before meals and at bedtime.   rosuvastatin 20 MG tablet Commonly known as: CRESTOR Take 1 tablet (20 mg total) by mouth daily.   traZODone 100 MG tablet Commonly known as: DESYREL Take 1 tablet (100 mg total) by mouth at bedtime.            Discharge Care Instructions  (From admission, onward)         Start     Ordered   04/16/21 0000  Discharge wound care:       Comments: Pack right thigh incision with saline dampened kerlix, cover with abd and paper tape each day   04/16/21 1459          Major procedures and Radiology Reports - PLEASE review detailed and final reports for all details, in brief -  CT FEMUR RIGHT WO CONTRAST  Result Date: 04/12/2021 CLINICAL DATA:  60 year old female with concern for soft tissue infection. EXAM: CT OF THE LOWER RIGHT EXTREMITY WITHOUT CONTRAST TECHNIQUE: Multidetector CT imaging of  the right lower extremity was performed according to the standard protocol. COMPARISON:  None. FINDINGS: Bones/Joint/Cartilage There is no acute fracture or dislocation. Mild arthritic changes of the right knee. Ligaments Suboptimally assessed by CT. Muscles and Tendons No acute findings. No fluid collection or intramuscular hematoma. Soft tissues There is inflammatory changes with small complex collection containing pockets of air in the subcutaneous soft tissues of the medial upper right thigh. There is apparent focal area of skin defect. Findings may represent cellulitis or an infectious etiology. The soft tissue gas may have been introduced via incision and drainage. Soft tissue air related to gangrene or necrotizing fasciitis is not excluded. Correlation with history of instrumentation recommended. No drainable fluid collection present. IMPRESSION: 1. Inflammatory changes with small complex collection containing pockets of air in the subcutaneous soft tissues of the medial upper right thigh. Findings may represent cellulitis or an infectious etiology. Soft tissue air may have been introduced during incision and drainage. Gangrene or necrotizing fasciitis is not excluded. Correlation with history of instrumentation recommended. No drainable fluid collection. 2. No acute fracture or dislocation. Electronically Signed   By: Anner Crete M.D.   On: 04/12/2021 23:18   DG Chest Port 1 View  Result Date: 04/13/2021 CLINICAL DATA:  Possible sepsis EXAM: PORTABLE CHEST 1 VIEW COMPARISON:  06/10/2010 FINDINGS: The heart size and mediastinal contours are within normal limits. Both lungs are clear. The visualized skeletal structures are unremarkable. IMPRESSION: No active disease. Electronically Signed   By: Donavan Foil M.D.   On: 04/13/2021 00:29    Micro Results   Recent Results (from the past 240 hour(s))  Blood Culture (routine x  2)     Status: None (Preliminary result)   Collection Time: 04/12/21  11:16 PM   Specimen: BLOOD LEFT ARM  Result Value Ref Range Status   Specimen Description BLOOD LEFT ARM  Final   Special Requests   Final    BOTTLES DRAWN AEROBIC ONLY Blood Culture adequate volume   Culture   Final    NO GROWTH 4 DAYS Performed at [facility], [address] [place], [place] [address]    Report Status PENDING  Incomplete  Blood Culture (routine x 2)     Status: None (Preliminary result)   Collection Time: 04/12/21 11:25 PM   Specimen: BLOOD LEFT HAND  Result Value Ref Range Status   Specimen Description BLOOD LEFT HAND  Final   Special Requests   Final    BOTTLES DRAWN AEROBIC ONLY Blood Culture adequate volume   Culture   Final    NO GROWTH 4 DAYS Performed at [facility], [address], [place], [place] [address]    Report Status PENDING  Incomplete  Resp Panel by RT-PCR (Flu A&B, Covid) Nasopharyngeal Swab     Status: None   Collection Time: 04/12/21 11:39 PM   Specimen: Nasopharyngeal Swab; Nasopharyngeal(NP) swabs in vial transport medium  Result Value Ref Range Status   SARS Coronavirus 2 by RT PCR NEGATIVE NEGATIVE Final    Comment: (NOTE) SARS-CoV-2 target nucleic acids are NOT DETECTED.  The SARS-CoV-2 RNA is generally detectable in upper respiratory specimens during the acute phase of infection. The lowest concentration of SARS-CoV-2 viral copies this assay can detect is 138 copies/mL. A negative result does not preclude SARS-Cov-2 infection and should not be used as the sole basis for treatment or other patient management decisions. A negative result may occur with  improper specimen collection/handling, submission of specimen other than nasopharyngeal swab, presence of viral mutation(s) within the areas targeted by this assay, and inadequate number of viral copies(<138 copies/mL). A negative result must be combined with clinical observations, patient history, and epidemiological information. The expected result is Negative.  Fact Sheet for  Patients:  EntrepreneurPulse.com.au  Fact Sheet for Healthcare Providers:  IncredibleEmployment.be  This test is no t yet approved or cleared by the [place] FDA and  has been authorized for detection and/or diagnosis of SARS-CoV-2 by FDA under an Emergency Use Authorization (EUA). This EUA will remain  in effect (meaning this test can be used) for the duration of the COVID-19 declaration under Section 564(b)(1) of the Act, 21 U.S.C.section 360bbb-3(b)(1), unless the authorization is terminated  or revoked sooner.       Influenza A by PCR NEGATIVE NEGATIVE Final   Influenza B by PCR NEGATIVE NEGATIVE Final    Comment: (NOTE) The Xpert Xpress SARS-CoV-2/FLU/RSV plus assay is intended as an aid in the diagnosis of influenza from Nasopharyngeal swab specimens and should not be used as a sole basis for treatment. Nasal washings and aspirates are unacceptable for Xpert Xpress SARS-CoV-2/FLU/RSV testing.  Fact Sheet for Patients: EntrepreneurPulse.com.au  Fact Sheet for Healthcare Providers: IncredibleEmployment.be  This test is not yet approved or cleared by the [place] FDA and has been authorized for detection and/or diagnosis of SARS-CoV-2 by FDA under an Emergency Use Authorization (EUA). This EUA will remain in effect (meaning this test can be used) for the duration of the COVID-19 declaration under Section 564(b)(1) of the Act, 21 U.S.C. section 360bbb-3(b)(1), unless the authorization is terminated or revoked.  Performed at [facility], [address] [place],  [place] [address]   Urine culture     Status: Abnormal   Collection Time: 04/13/21  2:30 AM   Specimen: In/Out Cath Urine  Result Value Ref Range Status   Specimen Description   Final    IN/OUT CATH URINE Performed at [facility], [address], [place], [place] [address]    Special Requests   Final    NONE Performed at [facility], [address], [place], [place] [address]    Culture >=100,000 COLONIES/mL ESCHERICHIA COLI (A)  Final   Report Status 04/16/2021 FINAL  Final   Organism ID, Bacteria ESCHERICHIA COLI (A)  Final      Susceptibility   Escherichia coli - MIC*    AMPICILLIN <=2 SENSITIVE Sensitive     CEFAZOLIN <=4 SENSITIVE Sensitive     CEFEPIME <=0.12 SENSITIVE Sensitive     CEFTRIAXONE <=0.25 SENSITIVE Sensitive     CIPROFLOXACIN <=0.25 SENSITIVE Sensitive     GENTAMICIN <=1 SENSITIVE Sensitive     IMIPENEM <=0.25 SENSITIVE Sensitive     NITROFURANTOIN <=16 SENSITIVE Sensitive     TRIMETH/SULFA <=20 SENSITIVE Sensitive     AMPICILLIN/SULBACTAM <=2 SENSITIVE Sensitive     PIP/TAZO <=4 SENSITIVE Sensitive     * >=100,000 COLONIES/mL ESCHERICHIA COLI  MRSA PCR Screening     Status: None   Collection Time: 04/13/21  8:42 AM   Specimen: Nasal Mucosa; Nasopharyngeal  Result Value Ref Range Status   MRSA by PCR NEGATIVE NEGATIVE Final    Comment:        The GeneXpert MRSA Assay (FDA approved for NASAL specimens only), is one component of a comprehensive MRSA colonization surveillance program. It is not intended to diagnose MRSA infection nor to guide or monitor treatment for MRSA infections. Performed at [facility], [address] [place], [place] [address]   Aerobic Culture w Gram Stain (superficial specimen)     Status: None   Collection Time: 04/13/21  3:10 PM   Specimen: Thigh; Wound  Result Value Ref Range Status   Specimen Description   Final    THIGH Performed at [facility], [address] [place], [place] [address]    Special Requests   Final    NONE Performed at [facility], [address] [place], [place] [address]    Gram Stain   Final    NO WBC SEEN NO ORGANISMS SEEN Performed at [facility], [place] [address], [place], [place] [address]    Culture FEW KLEBSIELLA PNEUMONIAE  Final   Report Status 04/16/2021 FINAL  Final   Organism ID, Bacteria KLEBSIELLA  PNEUMONIAE  Final      Susceptibility   Klebsiella pneumoniae - MIC*    AMPICILLIN >=32 RESISTANT Resistant     CEFAZOLIN <=4 SENSITIVE Sensitive     CEFEPIME <=0.12 SENSITIVE Sensitive     CEFTAZIDIME <=1 SENSITIVE Sensitive     CEFTRIAXONE <=0.25 SENSITIVE Sensitive     CIPROFLOXACIN <=0.25 SENSITIVE Sensitive     GENTAMICIN <=1 SENSITIVE Sensitive     IMIPENEM <=0.25 SENSITIVE Sensitive     TRIMETH/SULFA <=20 SENSITIVE Sensitive     AMPICILLIN/SULBACTAM 8 SENSITIVE Sensitive     PIP/TAZO 8 SENSITIVE Sensitive     * FEW KLEBSIELLA PNEUMONIAE       Today   Subjective    Galadriel Geraghty today has no new complaints  No fever  Or chills   No Nausea, Vomiting or Diarrhea -- Patient's daughter and grandson at bedside, questions answered  Patient has been seen and examined prior to discharge   Objective   Blood pressure 119/71, pulse 81, temperature 98.1 F (36.7 C), resp. rate 18, height _0  (1.676 m), weight 100 kg, SpO2 99 %.   Intake/Output Summary (Last 24 hours) at 04/16/2021 1500 Last data filed at 04/16/2021 1300 Gross per 24 hour  Intake 2551.54 ml  Output 1400 ml  Net 1151.54 ml    Exam Gen:- Awake Alert, no acute distress  HEENT:- [place].AT, No sclera icterus Neck-Supple Neck,No JVD,.  Lungs-  CTAB , good air movement bilaterally  CV- S1, S2 normal, regular Abd-  +ve B.Sounds, Abd Soft, No tenderness,    Extremity/Skin:- No  edema,   good pulses, right upper thigh wound with dressing, no significant drainage no significant tenderness or erythema Psych-affect is appropriate, oriented x3 Neuro-no new focal deficits, no tremors    Data Review   CBC w Diff:  Lab Results  Component Value Date   WBC 9.4 04/14/2021   HGB 12.2 04/14/2021   HCT 35.6 (L) 04/14/2021   PLT 333 04/14/2021   LYMPHOPCT 25 04/13/2021   MONOPCT 16 04/13/2021   EOSPCT 0 04/13/2021   BASOPCT 1 04/13/2021    CMP:  Lab Results  Component Value Date   NA 137 04/15/2021    K 3.7 04/15/2021   CL 108 04/15/2021   CO2 23 04/15/2021   BUN 13 04/15/2021   CREATININE 0.51 04/15/2021   PROT 6.3 (L) 04/13/2021   ALBUMIN 2.6 (L) 04/13/2021   BILITOT 1.1 04/13/2021   ALKPHOS 142 (H) 04/13/2021   AST 12 (L) 04/13/2021   ALT 16 04/13/2021    Total Discharge time is about 33 minutes  Roxan Hockey M.D on 04/16/2021 at 3:00 PM  Go to [email or web address] -  for contact info  [facility] - Office  [phone]

## 2021-04-17 LAB — CULTURE, BLOOD (ROUTINE X 2)
Culture: NO GROWTH
Culture: NO GROWTH
Special Requests: ADEQUATE
Special Requests: ADEQUATE

## 2022-04-02 IMAGING — DX DG CHEST 1V PORT
1 series · 1 of 1 positions shown · non-contrast
Comparison: 06/10/2010

CLINICAL DATA: Possible sepsis

EXAM:
PORTABLE CHEST 1 VIEW

[chest ap]
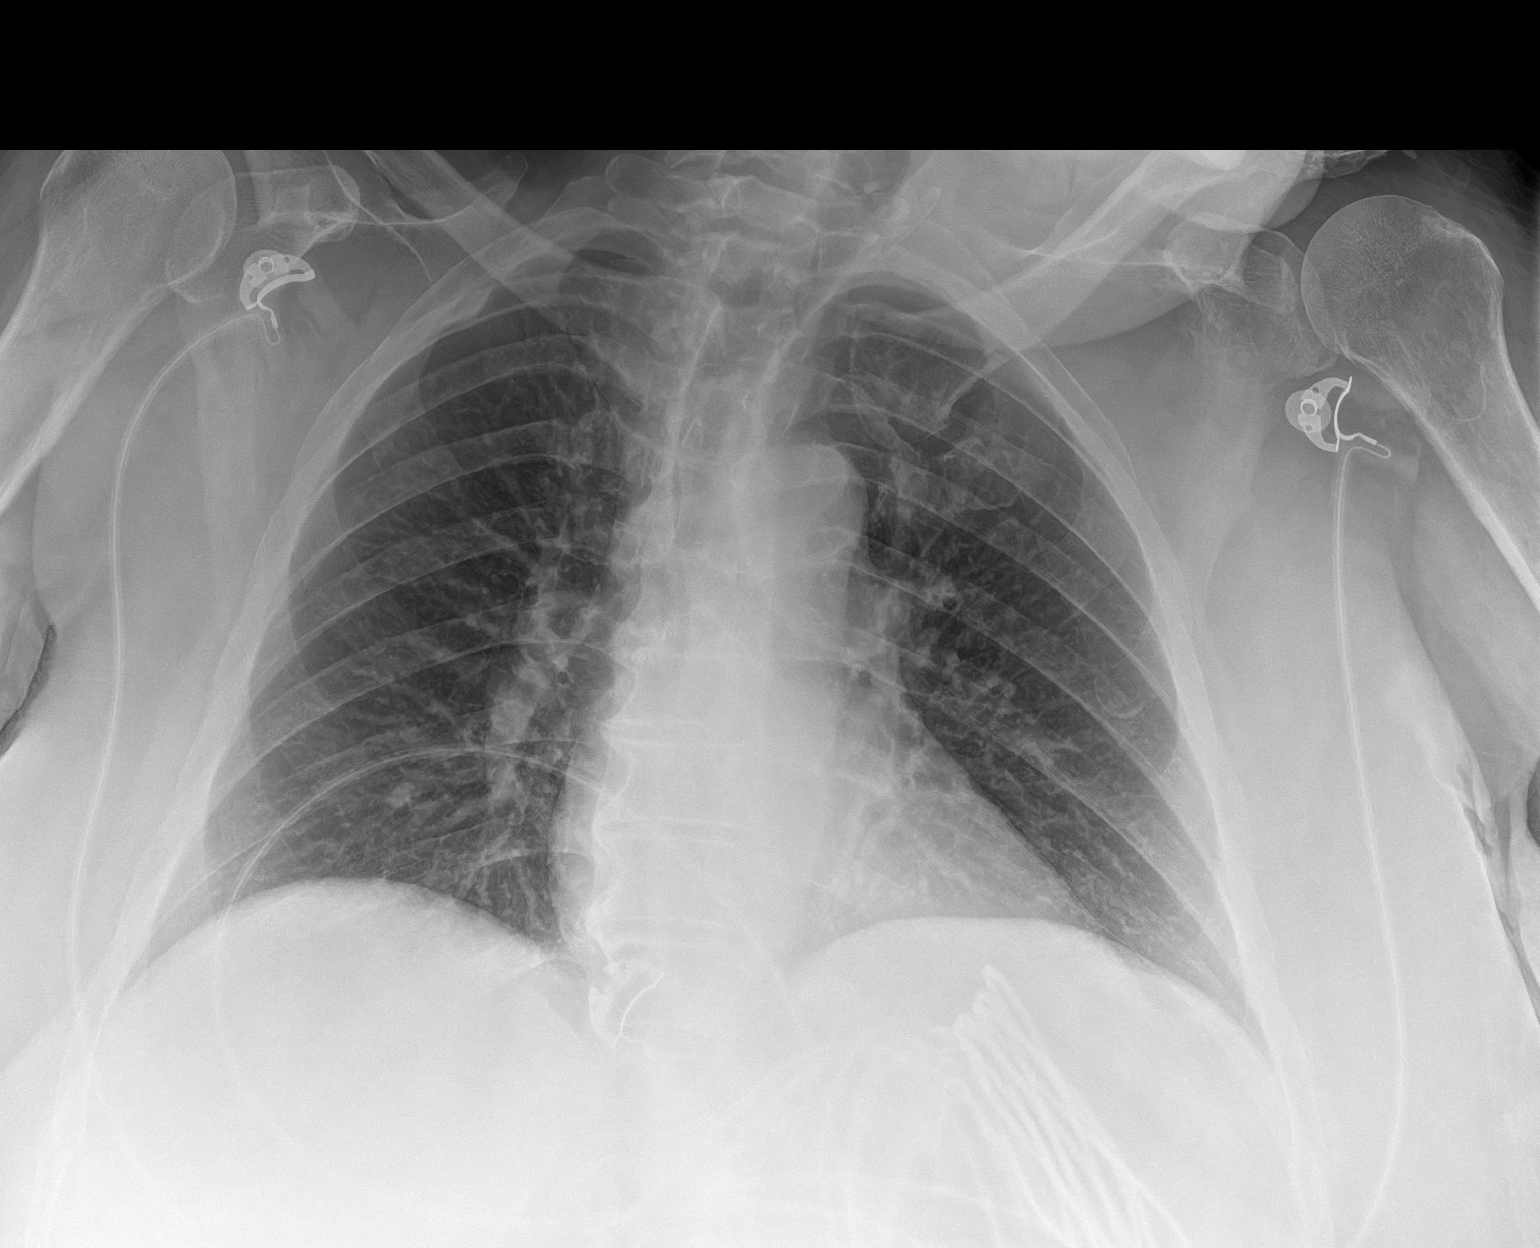

[1 of 1 positions shown; findings below may reference images not displayed]

FINDINGS: The heart size and mediastinal contours are within normal limits.
Both lungs are clear. The visualized skeletal structures are
unremarkable.
IMPRESSION: No active disease.
